# Patient Record
Sex: Male | Born: 1960 | Race: White | Hispanic: No | Marital: Married | Smoking: Current every day smoker
Health system: Southern US, Community
[De-identification: ages and names within clinical notes are randomized; demographics above are authoritative.]

## PROBLEM LIST (undated history)

## (undated) DIAGNOSIS — L97509 Non-pressure chronic ulcer of other part of unspecified foot with unspecified severity: Secondary | ICD-10-CM

## (undated) HISTORY — DX: Non-pressure chronic ulcer of other part of unspecified foot with unspecified severity: L97.509

---

## 1997-09-25 ENCOUNTER — Encounter: Admission: RE | Admit: 1997-09-25 | Discharge: 1997-12-24 | Payer: Self-pay | Admitting: Anesthesiology

## 1998-05-20 ENCOUNTER — Encounter: Admission: RE | Admit: 1998-05-20 | Discharge: 1998-08-18 | Payer: Self-pay | Admitting: Anesthesiology

## 1998-11-16 ENCOUNTER — Encounter: Admission: RE | Admit: 1998-11-16 | Discharge: 1999-02-14 | Payer: Self-pay | Admitting: Anesthesiology

## 1999-05-20 ENCOUNTER — Encounter: Admission: RE | Admit: 1999-05-20 | Discharge: 1999-08-18 | Payer: Self-pay | Admitting: Anesthesiology

## 1999-11-11 ENCOUNTER — Encounter: Admission: RE | Admit: 1999-11-11 | Discharge: 2000-02-09 | Payer: Self-pay | Admitting: Anesthesiology

## 2000-05-29 ENCOUNTER — Encounter: Admission: RE | Admit: 2000-05-29 | Discharge: 2000-08-27 | Payer: Self-pay | Admitting: Anesthesiology

## 2000-09-07 ENCOUNTER — Encounter: Admission: RE | Admit: 2000-09-07 | Discharge: 2000-12-02 | Payer: Self-pay | Admitting: Anesthesiology

## 2001-05-25 ENCOUNTER — Encounter: Payer: Self-pay | Admitting: Anesthesiology

## 2001-05-25 ENCOUNTER — Ambulatory Visit (HOSPITAL_COMMUNITY): Admission: RE | Admit: 2001-05-25 | Discharge: 2001-05-25 | Payer: Self-pay | Admitting: Anesthesiology

## 2001-07-10 ENCOUNTER — Encounter: Payer: Self-pay | Admitting: Neurosurgery

## 2001-07-10 ENCOUNTER — Encounter: Admission: RE | Admit: 2001-07-10 | Discharge: 2001-07-10 | Payer: Self-pay | Admitting: Neurosurgery

## 2001-07-26 ENCOUNTER — Encounter: Payer: Self-pay | Admitting: Neurosurgery

## 2001-07-26 ENCOUNTER — Ambulatory Visit (HOSPITAL_COMMUNITY): Admission: RE | Admit: 2001-07-26 | Discharge: 2001-07-27 | Payer: Self-pay | Admitting: Neurosurgery

## 2001-09-03 ENCOUNTER — Encounter: Admission: RE | Admit: 2001-09-03 | Discharge: 2001-10-03 | Payer: Self-pay | Admitting: Neurosurgery

## 2001-12-28 ENCOUNTER — Encounter: Payer: Self-pay | Admitting: Neurosurgery

## 2001-12-28 ENCOUNTER — Encounter: Admission: RE | Admit: 2001-12-28 | Discharge: 2001-12-28 | Payer: Self-pay | Admitting: Neurosurgery

## 2002-05-17 ENCOUNTER — Encounter: Payer: Self-pay | Admitting: Neurosurgery

## 2002-05-22 ENCOUNTER — Inpatient Hospital Stay (HOSPITAL_COMMUNITY): Admission: RE | Admit: 2002-05-22 | Discharge: 2002-05-27 | Payer: Self-pay | Admitting: Neurosurgery

## 2002-05-22 ENCOUNTER — Encounter: Payer: Self-pay | Admitting: Neurosurgery

## 2002-06-04 ENCOUNTER — Encounter: Admission: RE | Admit: 2002-06-04 | Discharge: 2002-07-25 | Payer: Self-pay | Admitting: Neurosurgery

## 2002-07-08 ENCOUNTER — Encounter: Payer: Self-pay | Admitting: Neurosurgery

## 2002-07-08 ENCOUNTER — Encounter: Admission: RE | Admit: 2002-07-08 | Discharge: 2002-07-08 | Payer: Self-pay | Admitting: Neurosurgery

## 2002-11-15 ENCOUNTER — Encounter: Admission: RE | Admit: 2002-11-15 | Discharge: 2002-11-15 | Payer: Self-pay | Admitting: Neurosurgery

## 2002-11-15 ENCOUNTER — Encounter: Payer: Self-pay | Admitting: Neurosurgery

## 2004-10-29 ENCOUNTER — Ambulatory Visit (HOSPITAL_COMMUNITY): Admission: RE | Admit: 2004-10-29 | Discharge: 2004-10-29 | Payer: Self-pay | Admitting: Anesthesiology

## 2004-12-28 ENCOUNTER — Encounter: Admission: RE | Admit: 2004-12-28 | Discharge: 2005-03-01 | Payer: Self-pay | Admitting: Neurosurgery

## 2009-02-28 ENCOUNTER — Encounter: Admission: RE | Admit: 2009-02-28 | Discharge: 2009-02-28 | Payer: Self-pay | Admitting: Anesthesiology

## 2009-03-31 ENCOUNTER — Encounter: Admission: RE | Admit: 2009-03-31 | Discharge: 2009-04-02 | Payer: Self-pay | Admitting: Neurosurgery

## 2009-04-02 ENCOUNTER — Encounter: Admission: RE | Admit: 2009-04-02 | Discharge: 2009-05-12 | Payer: Self-pay | Admitting: Neurosurgery

## 2010-08-20 NOTE — Op Note (Signed)
NAME:  Scott Flowers, PETTET                          ACCOUNT NO.:  [id number]   MEDICAL RECORD NO.:  [id number]                   PATIENT TYPE:  INP   LOCATION:  3172                                 FACILITY:  [facility]   PHYSICIAN:  Donalee Citrin, M.D.                     DATE OF BIRTH:  04-26-1960   DATE OF PROCEDURE:  05/22/2002  DATE OF DISCHARGE:                                 OPERATIVE REPORT   PREOPERATIVE DIAGNOSIS:  Severe spinal stenosis L4-5 and L5-S1.  Large  ruptured disk, L4-5, L5-S1, right greater than left at L4-5, left greater  than right at L5-S1.  Diskogenic mechanical back pain.   POSTOPERATIVE DIAGNOSIS:  Severe spinal stenosis L4-5 and L5-S1.  Large  ruptured disk, L4-5, L5-S1, right greater than left at L4-5, left greater  than right at L5-S1.  Diskogenic mechanical back pain.   OPERATION PERFORMED:  Redo decompressive laminectomy at L4-5 and redo  decompressive laminectomy at L5-S1.  Posterior lumbar interbody fusion at L4-  5 and L5-S1 using Apex 26 mm tangent allograft wedges, posterolateral  arthrodesis, L4-5, L5-S1.  Pedicle screw fixation, L4-5 and L5-S1.  Placement of a medium Hemovac drain.   SURGEON:  Donalee Citrin, M.D.   ASSISTANT:  Kathaleen Maser. Pool, M.D.   ANESTHESIA:  General endotracheal.   INDICATIONS FOR PROCEDURE:  The patient is a very pleasant 50 year old  gentleman who has had three previous back surgeries.  Ever since his last  back surgery, he has had persistent pain and hypesthesia down the left leg  in what appeared to be an L4 and L5 distribution.  This has been refractory  to conservative treatment with pain management, injections and therapy.  The  patient kept getting progressively worse with worsening intractable back  pain, much greater than leg pain but severe hypesthesias also on the left  leg.  Repeat imaging with MRI and myelography showed severe spinal stenosis  and recurrent ruptured disk partially calcified at L4-5 and L5-S1.  The  patient was extensively counseled about the risks and benefits of redo  surgery on his back and that being a fusion.  He was explained the risks and  benefits of the procedure and he understands and agreed to proceed.   DESCRIPTION OF PROCEDURE:  The patient was brought to the operating room and  was induced under general anesthesia and was placed in the prone position.  The old incision was opened up and then the scar tissue was divided  bilaterally exposing the transverse processes of L4, 5 and S1 and the facet  complex of at each one of these levels with significant amount of scar  tissue appreciated.  Then once the facet complexes were identified and  transverse processes were identified as well as the pedicles of L4-5 and S1,  radical decompressive laminectomy was begun.  Leksell rongeurs were used to  remove the spinous  process and starting at the superior aspect the residual  spinous process of L4 using a 13 mm Kerrison punch, this was removed  centrally exposing the underlying dura.  This was then continued out  laterally, first on the right side and there was noted to be tremendous  amount of scar along the lateral dura on the right.  This was all freed away  with dental dissectors as well as a 4 Penfield exposing the L4 nerve root.  This was adequately decompressed out its foramen.  At the medial aspect of  the facet, this was removed at L4-5 on the right and this was continued down  at L5-S1 on the right.  Then both at the end of the decompression the L4, L5  and S1 neural foramina were widely patent.  There was noted to be a very  large calcified disk at L4-5 compressing both the L4 and L5 nerve root on  the right and L5-S1 was noted to be partially calcified disk there as well.  Attention was taken to the left side.  Starting from the right side and  working over, the scar tissue was freed up.  Using 3 and 4 mm Kerrison punch  the remainder of the laminotomy was completed at L4  decompressing the L4  nerve root radically out its foramen.  This was noted to be completely  decompressed.  Then attention was taken to identify the L5 nerve root.  This  was noted to be very tight in the foramen with a large amount of scar tissue  and facet arthropathy.  This was also removed in piecemeal fashion with  microdissection technique removing scar tissue.  Then there was noted to be  a very large calcified disk and tremendous amount of scar tissue and  stenosis at L5-S1.  This was all removed in piecemeal fashion decompressing  the S1 nerve root.  There was noted to be a very large calcified disk  residual compressing both the L5 and S1 nerve root.  Then at the end of the  decompression with all L4, L5 and S1 nerve roots bilaterally exposed,  attention was taken to diskectomy.  First starting on the right side,  annulotomy was made with pituitary rongeurs were used to clean out the disk  space and an 8 distractor was inserted on the right side at L5-S1 and  fluoroscopy confirmed this to be the proper size.  Then D'Errico retractor  was used to reflect the scar tissue at L5-S1 medially.  This was all  dissected free with dental dissectors and 4 Penfield and a very large  calcified ruptured disk was removed with pituitary rongeurs and downgoing  Epstein curets as well as Kerrison punches decompressing both the S1 and L5  nerve roots on that side.  At the end of this, both a size 8 cutter and  chisel were used to prepare the end plates and an 8 x 26 mm tangent  allograft inserted on the left side.  Fluoroscopy confirmed depth and  trajectory on the right.  This procedure was repeated on the right side with  an 8 x 26 mm tangent allograft after locally harvested autograft was packed  against the allograft on the left.  Fluoroscopy again confirmed depth and  trajectory of all positions along the way.  Then on the left side at L4-5 this disk was cleaned out radically and an 8  distractor was inserted and on  the right side there was noted to be  a very large ruptured disk that was all  partially calcified and stuck at the surface of the L5 nerve root.  This was  teased away with 4 Penfield dental dissectors and downgoing Epstein curets.  A small dural rent was appreciated at the level of the disk space at L4-5.  This was oversewn with a 6-0 Prolene and the remainder of the disk was  removed from underneath this as well as a large piece of calcified fragment  compressing the right L4 nerve root.  This was all removed and both the L4  and L5 nerve were rapidly decompressed at the end of the diskectomy and  using a size 8 cutter and chisel, this end plate was prepared to receive a  bone graft 8 x 26 mm tangent allograft was placed on the  right side at L4-  5.  Then the 8 distractor was removed and the left side at L4-5 was  radically cleaned out and then the 8 x 24 mm tangent allograft inserted  there.  Both allograft and wedges were in good position, confirmed by  fluoroscopy  and all neural foramina were widely patent.  All nerves and  thecal sac were widely decompressed at that point.  Then attention was  turned to pedicle screw placement.  Pedicle screw at L4 on the right was  done first.  Using a high speed drill pilot hole was drilled and cannulated  with the awl, probed, tapped with a 5.5 tap then a 6 and followed by a 6.5  and inserted at L4.  this procedure was repeated at L5 and S1 on the right  with a 6.5 x 45 at L5 and a 6.5 x 40 at S1 and again the procedure was  repeated on the left side.  All pedicles were competent  and free to  orientation upon palpation from within the pedicle and from direct  inspection within the canal.  All pedicles were noted to not violate the  medial aspect of the pedicle.  Then the lateral facet complex and transverse  processes were decorticated __________  packed along the lateral gutters.  The 16 mm rod was sized,  selected and contoured and then tightened down as  S1.  L5 pedicle screws were compressed against S1 and the L4 pedicle screw  compressed against L5.  The crosslink was inserted L4-5 and then Gelfoam was  laid over the left side.  Tisseal was placed on the right side just beside  the dural rent.  Gelfoam was overlaid top of that. He was noted to have no  further signs of leak and due to the patient's size and muscle dissection,  it was felt that it was important to leave the drain for at least 24 hours  to remove the residual oozing from the muscle even though __________  Then  the muscle fascia were reapproximated with 0 interrupted Vicryl,  subcutaneous tissue closed with 2-0 undyed Vicryl, skin closed with nylon.  Bacitracin and wound dressing was applied.  The patient went to recovery in  stable condition.  At the end of the case instrument and sponge counts were  correct.                                                Donalee Citrin, M.D.   GC/MEDQ  D:  05/22/2002  T:  05/22/2002  Job:  [id number]

## 2010-08-20 NOTE — Discharge Summary (Signed)
   NAME:  Scott Flowers, Scott Flowers                          ACCOUNT NO.:  [id number]   MEDICAL RECORD NO.:  [id number]                   PATIENT TYPE:  INP   LOCATION:  3007                                 FACILITY:  [facility]   PHYSICIAN:  Donalee Citrin, M.D.                     DATE OF BIRTH:  21-May-1960   DATE OF ADMISSION:  05/22/2002  DATE OF DISCHARGE:  05/27/2002                                 DISCHARGE SUMMARY   ADMISSION DIAGNOSIS:  Severe degenerative disk disease at L4-5 and L5-S1.   PROCEDURE:  Decompressive laminectomy and fusion, L4-5 and L5-S1.   HOSPITAL COURSE:  The patient is a very pleasant 50 year old gentleman who  has had longstanding back and leg pain and is status post multiple  laminectomies with persistent pain and hyperpathia.  The patient was  admitted and he went to the operating room and underwent the aforementioned  procedure.  Postop the patient did very well in the recovery room and the  floor. The patient had a Hemovac drain placed; however, it likely was  draining some spinal fluid so this was discontinued on the first  postoperative day.  The patient remained afebrile and progressively  mobilized with physical therapy.  He had significant improvement of both his  leg pain and his hyperpathia.  His back pain was well-controlled on pills.  By postop day 5 the patient was ambulating and voiding spontaneously, pain  controlled on pills, discharged home in a brace.                                               Donalee Citrin, M.D.    GC/MEDQ  D:  08/09/2002  T:  08/09/2002  Job:  [id number]

## 2010-08-20 NOTE — H&P (Signed)
[facility]  Patient:    Scott Flowers, Scott Flowers                       MRN: [id number] Adm. Date:  [id number] Attending:  Thyra Breed CC:         Scott Flowers. Scott Flowers, M.D.  Scott Flowers., M.D.   History and Physical  FOLLOWUP EVALUATION:  The patient comes in prematurely because of a problem with back pain which acutely was worsened over the weekend after he sneezed. Initially, on Saturday and Sunday, he felt as though it would bring him to his knees, but through the course of the week with Soma and Percocet up to two tablets per day, it has settled down somewhat.  It is minimal in the mornings but by late in the afternoon, it is up to 5/10.  The pain is localized to the back.  It is a sharp shearing pain which does not radiate out into the extremities.  There is no associated numbness, tingling or bowel or bladder changes.  He feels markedly improved since speaking with [place] on Monday.  PHYSICAL EXAMINATION:  VITAL SIGNS:  Blood pressure 144/80.  Heart rate is 82.  Respiratory rate is 18.  O2 saturation is 98%.  He rates his pain at 2/10.  NEUROMUSCULAR:  Deep tendon reflexes in the lower extremities showed symmetric at the knees, absent right ankle, 1+ left ankle.  Straight leg raise signs are negative.  Gait is intact.  Hyperextension of the back does not increase his pain but forward flexion to about 20 degrees to 30 degrees does increase the pain.  IMPRESSION: 1. Chronic low back pain with underlying degenerative disk disease and facet    joint arthropathy with an acute flare-up which I suspect is probably an    annular rent. 2. Other medical problems per primary care physician.  DISPOSITION: 1. Continue with the Soma and Percocet for the time-being. 2. If he is not better in two weeks, he is to call me and we will consider a    series of lumbar epidural steroid injections versus further imaging of his    back.  He tells me Dr. Loistine Chance C. Flowers  is not interested in any further    surgical interventions for the time-being. DD:  09/08/00 TD:  09/08/00 Job: [id number]

## 2011-07-15 ENCOUNTER — Other Ambulatory Visit: Payer: Self-pay | Admitting: Anesthesiology

## 2011-07-15 DIAGNOSIS — M25551 Pain in right hip: Secondary | ICD-10-CM

## 2011-08-01 ENCOUNTER — Ambulatory Visit
Admission: RE | Admit: 2011-08-01 | Discharge: 2011-08-01 | Disposition: A | Discharge status: Home / Self Care | Payer: BC Managed Care – PPO | Source: Ambulatory Visit | Attending: Anesthesiology | Admitting: Anesthesiology

## 2011-08-01 DIAGNOSIS — M25551 Pain in right hip: Secondary | ICD-10-CM

## 2011-08-01 MED ORDER — GADOBENATE DIMEGLUMINE 529 MG/ML IV SOLN
20.0000 mL | Freq: Once | INTRAVENOUS | Status: AC | PRN
  Administered 2011-08-01: 20 mL via INTRAVENOUS

## 2011-12-08 ENCOUNTER — Ambulatory Visit: Payer: BC Managed Care – PPO

## 2012-07-16 ENCOUNTER — Encounter: Payer: Self-pay | Admitting: Podiatry

## 2012-07-16 ENCOUNTER — Ambulatory Visit (INDEPENDENT_AMBULATORY_CARE_PROVIDER_SITE_OTHER): Payer: BLUE CROSS/BLUE SHIELD | Admitting: Podiatry

## 2012-07-16 VITALS — BP 152/74 | HR 67 | Ht 74.5 in | Wt 295.0 lb

## 2012-07-16 DIAGNOSIS — B351 Tinea unguium: Secondary | ICD-10-CM

## 2012-07-16 DIAGNOSIS — E1140 Type 2 diabetes mellitus with diabetic neuropathy, unspecified: Secondary | ICD-10-CM

## 2012-07-16 DIAGNOSIS — L8990 Pressure ulcer of unspecified site, unspecified stage: Secondary | ICD-10-CM

## 2012-07-16 DIAGNOSIS — L8992 Pressure ulcer of unspecified site, stage 2: Secondary | ICD-10-CM

## 2012-07-16 DIAGNOSIS — M25579 Pain in unspecified ankle and joints of unspecified foot: Secondary | ICD-10-CM

## 2012-07-16 DIAGNOSIS — E1149 Type 2 diabetes mellitus with other diabetic neurological complication: Secondary | ICD-10-CM

## 2012-07-16 NOTE — Patient Instructions (Addendum)
Trimmed fissured heel callus and nails. Return in one months if no problems arise.

## 2012-07-16 NOTE — Progress Notes (Signed)
Subjective: 52 y.o. year old male patient presents complaining of pain on both feet.  Patient is a diabetic with a history of none healing ulcer and frequent fissuring heels.  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 Stated that while he staying at home he was on feet more than usual.   Patient Summary List & History reviewed for allergies, medications, medical problems and surgical history.  Review of Systems - General ROS: negative for - chills, fatigue, fever, malaise, night sweats, weight gain or weight loss  Objective: Dermatologic:  Thin and dry flaky skin on both leg.  Hard excess callus under the first MPJ and great toe right.  Excess callus build up under the 5th MPJ left with pre-ulcerative cracks in callused skin. This is the previous ulcer site.  Both heels have cracking dry callus with fissured heels. No skin is broken at this time.  All nails are thick and hypertrophic.  Vascular: Positive of swollen lower limb bilateral with poor skin texture. DP is palpable bilateral.  Orthopedic:  Rectus foot with excess pressure under the 5th MPJ left foot.  Neurologic:  Loss of normal protective sensory perception bilateral.  Assessment: Dystrophic mycotic nails x 10. Pre-ulcerative skin callus bilateral balls and heels.  Treatment: Both feet were soaked in Aloe solution for 20 minutes. All mycotic nails and pre-ulcerative callused skin lesions debrided.  All excess dry callused skin was also removed.  Vitamin A cream applied to both feet and leg. Advised to try Compression socks to reduce leg edema.  Return in one month unless there is any problems.

## 2012-08-15 ENCOUNTER — Ambulatory Visit: Payer: BLUE CROSS/BLUE SHIELD | Admitting: Podiatry

## 2012-08-21 ENCOUNTER — Ambulatory Visit (INDEPENDENT_AMBULATORY_CARE_PROVIDER_SITE_OTHER): Payer: BC Managed Care – PPO | Admitting: Podiatry

## 2012-08-21 VITALS — BP 156/70 | HR 62

## 2012-08-21 DIAGNOSIS — L84 Corns and callosities: Secondary | ICD-10-CM

## 2012-08-21 DIAGNOSIS — L988 Other specified disorders of the skin and subcutaneous tissue: Secondary | ICD-10-CM

## 2012-08-21 DIAGNOSIS — E1149 Type 2 diabetes mellitus with other diabetic neurological complication: Secondary | ICD-10-CM

## 2012-08-21 DIAGNOSIS — R234 Changes in skin texture: Secondary | ICD-10-CM | POA: Insufficient documentation

## 2012-08-21 NOTE — Patient Instructions (Addendum)
Seen for fissured heel and left foot. All calluses trimmed. Continue to use Moisturizing cream. Return in 1 month.

## 2012-08-21 NOTE — Progress Notes (Signed)
S:  Seen for fissuring heel and pre ulcerative callus left foot under 5th Metatarsal head. He has been to beach and feel his feet are fine. O:  Both feet are in good shape without any acute lesions. Thick broad callus under 5th MPJ left. Fissuring heel right. A:  Pre-ulcerative callus left foot, old ulcer site. Fissuring callus right heel. P:  All debrided and applied moisturizing lotion. Return in one month.

## 2012-09-19 ENCOUNTER — Ambulatory Visit (INDEPENDENT_AMBULATORY_CARE_PROVIDER_SITE_OTHER): Payer: BC Managed Care – PPO | Admitting: Podiatry

## 2012-09-19 VITALS — BP 162/65 | HR 65

## 2012-09-19 DIAGNOSIS — L988 Other specified disorders of the skin and subcutaneous tissue: Secondary | ICD-10-CM

## 2012-09-19 DIAGNOSIS — E1149 Type 2 diabetes mellitus with other diabetic neurological complication: Secondary | ICD-10-CM

## 2012-09-19 DIAGNOSIS — B351 Tinea unguium: Secondary | ICD-10-CM

## 2012-09-19 DIAGNOSIS — M25579 Pain in unspecified ankle and joints of unspecified foot: Secondary | ICD-10-CM

## 2012-09-19 DIAGNOSIS — R234 Changes in skin texture: Secondary | ICD-10-CM

## 2012-09-19 DIAGNOSIS — L84 Corns and callosities: Secondary | ICD-10-CM

## 2012-09-21 NOTE — Progress Notes (Signed)
Subjective:  52 year male presents for follow up on fissuring heel and pre ulcerative callus left foot under 5th Metatarsal head. Also requests for toe nails trimmed. Both feet are numb on bottom and hurts.   Objective:  Thick hypertrophic nails x 10.  Both feet are in good shape without any acute lesions.  Thick broad callus under 5th MPJ left.  Fissuring heel posterior plantar right.  Assessment:  Onychomycosis x 10. Pre-ulcerative callus left foot, old ulcer site.  Fissuring callus right heel.  Peripheral neuropathy. Type II IDDM  Plan:  All debrided pressure callus and fissuring callus,and applied moisturizing lotion.  All nails debrided. Return in one month.

## 2012-10-19 ENCOUNTER — Encounter: Payer: Self-pay | Admitting: Podiatry

## 2012-10-19 ENCOUNTER — Ambulatory Visit (INDEPENDENT_AMBULATORY_CARE_PROVIDER_SITE_OTHER): Payer: BC Managed Care – PPO | Admitting: Podiatry

## 2012-10-19 VITALS — BP 160/68 | HR 68 | Ht 74.5 in | Wt 280.0 lb

## 2012-10-19 DIAGNOSIS — E1149 Type 2 diabetes mellitus with other diabetic neurological complication: Secondary | ICD-10-CM

## 2012-10-19 DIAGNOSIS — R234 Changes in skin texture: Secondary | ICD-10-CM

## 2012-10-19 DIAGNOSIS — L988 Other specified disorders of the skin and subcutaneous tissue: Secondary | ICD-10-CM

## 2012-10-19 NOTE — Progress Notes (Signed)
Subjective:  52 year male presents for follow up on fissuring heel and pre ulcerative callus left foot under 5th Metatarsal head.  Both feet are numb on bottom and hurts.  Objective:  Both feet are in good shape without any acute lesions.  Thick broad callus under 5th MPJ left.  Fissuring heel posterior plantar right.  Assessment:  Pre-ulcerative callus left foot, old ulcer site.  Fissuring callus right heel.  Peripheral neuropathy.  Type II IDDM  Plan:  Soaked both feet for 10 minutes.  Debrided pressure all callus and fissuring callus. Return in 6 weeks.

## 2012-11-30 ENCOUNTER — Ambulatory Visit: Payer: BC Managed Care – PPO | Admitting: Podiatry

## 2012-12-04 ENCOUNTER — Ambulatory Visit (INDEPENDENT_AMBULATORY_CARE_PROVIDER_SITE_OTHER): Payer: BC Managed Care – PPO | Admitting: Podiatry

## 2012-12-04 DIAGNOSIS — B351 Tinea unguium: Secondary | ICD-10-CM

## 2012-12-04 DIAGNOSIS — L988 Other specified disorders of the skin and subcutaneous tissue: Secondary | ICD-10-CM

## 2012-12-04 DIAGNOSIS — M79673 Pain in unspecified foot: Secondary | ICD-10-CM | POA: Insufficient documentation

## 2012-12-04 DIAGNOSIS — R234 Changes in skin texture: Secondary | ICD-10-CM

## 2012-12-04 DIAGNOSIS — L84 Corns and callosities: Secondary | ICD-10-CM

## 2012-12-04 DIAGNOSIS — E1149 Type 2 diabetes mellitus with other diabetic neurological complication: Secondary | ICD-10-CM

## 2012-12-04 DIAGNOSIS — M79609 Pain in unspecified limb: Secondary | ICD-10-CM

## 2012-12-04 NOTE — Progress Notes (Signed)
Subjective:  52 year male presents for follow up on fissuring heel and pre ulcerative callus left foot under 5th Metatarsal head.  Both feet are numb on bottom and hurts.   Objective:  Thick broad calluses under ball, 5th MPJ bilateral. Thick calluses that are cracked with deep fissure on heels posterior plantar bilateral. Thick mycotic nails x 10.  Assessment:  Pre-ulcerative callus left foot, old ulcer site.  Fissuring callus bilateral heel.  Mycotic nails x 10. Peripheral neuropathy.  Type II IDDM   Plan:  Soaked both feet for 10 minutes.  Debrided pressure all callus and fissuring callus and all nails. Return in 4 weeks.

## 2013-01-01 ENCOUNTER — Encounter: Payer: Self-pay | Admitting: Podiatry

## 2013-01-01 ENCOUNTER — Ambulatory Visit (INDEPENDENT_AMBULATORY_CARE_PROVIDER_SITE_OTHER): Payer: BC Managed Care – PPO | Admitting: Podiatry

## 2013-01-01 VITALS — BP 147/66 | HR 66 | Ht 74.0 in | Wt 283.0 lb

## 2013-01-01 DIAGNOSIS — R234 Changes in skin texture: Secondary | ICD-10-CM

## 2013-01-01 DIAGNOSIS — E1149 Type 2 diabetes mellitus with other diabetic neurological complication: Secondary | ICD-10-CM

## 2013-01-01 DIAGNOSIS — M79609 Pain in unspecified limb: Secondary | ICD-10-CM

## 2013-01-01 DIAGNOSIS — L988 Other specified disorders of the skin and subcutaneous tissue: Secondary | ICD-10-CM

## 2013-01-01 NOTE — Patient Instructions (Addendum)
Subjective:  52 year male presents for follow up on fissuring heel and pre ulcerative callus on both feet. Feet are numb on bottom and hurts.  Objective:  Thick broad calluses under ball, 5th MPJ bilateral.  Thick calluses that are cracked with deep fissure on heels posterior plantar bilateral.  Numbness on both feet R>L.  Assessment:  Pre-ulcerative callus left foot, old ulcer site.  Fissuring callus bilateral heel.  Peripheral neuropathy.  Type II IDDM   Plan:  Soaked both feet for 10 minutes.  Debrided pressure all callus and fissuring callus on both feet.  Return after trip.

## 2013-02-19 ENCOUNTER — Encounter: Payer: Self-pay | Admitting: Podiatry

## 2013-02-19 ENCOUNTER — Ambulatory Visit (INDEPENDENT_AMBULATORY_CARE_PROVIDER_SITE_OTHER): Payer: BC Managed Care – PPO | Admitting: Podiatry

## 2013-02-19 VITALS — BP 134/71 | HR 69 | Ht 74.0 in | Wt 287.0 lb

## 2013-02-19 DIAGNOSIS — B351 Tinea unguium: Secondary | ICD-10-CM

## 2013-02-19 DIAGNOSIS — L84 Corns and callosities: Secondary | ICD-10-CM

## 2013-02-19 DIAGNOSIS — L97509 Non-pressure chronic ulcer of other part of unspecified foot with unspecified severity: Secondary | ICD-10-CM

## 2013-02-19 DIAGNOSIS — R234 Changes in skin texture: Secondary | ICD-10-CM

## 2013-02-19 DIAGNOSIS — E1149 Type 2 diabetes mellitus with other diabetic neurological complication: Secondary | ICD-10-CM

## 2013-02-19 DIAGNOSIS — M79609 Pain in unspecified limb: Secondary | ICD-10-CM

## 2013-02-19 DIAGNOSIS — L988 Other specified disorders of the skin and subcutaneous tissue: Secondary | ICD-10-CM

## 2013-02-19 HISTORY — DX: Non-pressure chronic ulcer of other part of unspecified foot with unspecified severity: L97.509

## 2013-02-19 NOTE — Patient Instructions (Signed)
Seen for right great toe wound. Seen another deep crack on right heel with thick callus. All calluses debrided. Right great toe wound is clean and superficial without signs of infection. Return in 2 weeks. Do daily wound care with antibiotic cream.  If the toe gets more red, will put on antibiotics.

## 2013-02-19 NOTE — Progress Notes (Signed)
Seen for right great toe wound. He was on a cruise trip and been on feet a lot. He had skin blister on plantar with redness on toe right hallux.  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 He sent to [place] cell phone picture of the toe yesterday that revealed great toe with loss of skin layer.  Objective: Pink raw skin without sign of infection or necrosis. Dorsal aspect of the great toe has been irritated with mild red purple discoloration in skin and nail. Nail is firm without sign of subungual fluid.  Has noted another deep crack on right heel about 4cm long and 0.4cm deep with thick surrounding callus. All nails are hypertrophic.  Assessment: Friction and pressure induced trauma right hallux with loss of dermal layer. No compound infection noted. Hypertrophic nails x 10. Deep fissure right heel with thick callus. Thick callus left heel.  Treatment: Both feet were soaked for 10 minutes. All calluses debrided. Right great toe wound redressed with Silver cream.  Return in 2 weeks.  Do daily wound care with antibiotic cream.  If the toe gets more red, will put on antibiotics.

## 2013-03-05 ENCOUNTER — Encounter: Payer: Self-pay | Admitting: Podiatry

## 2013-03-05 ENCOUNTER — Ambulatory Visit (INDEPENDENT_AMBULATORY_CARE_PROVIDER_SITE_OTHER): Payer: BC Managed Care – PPO | Admitting: Podiatry

## 2013-03-05 VITALS — BP 159/70 | HR 65 | Ht 74.0 in | Wt 287.0 lb

## 2013-03-05 DIAGNOSIS — L988 Other specified disorders of the skin and subcutaneous tissue: Secondary | ICD-10-CM

## 2013-03-05 DIAGNOSIS — R234 Changes in skin texture: Secondary | ICD-10-CM

## 2013-03-05 DIAGNOSIS — L84 Corns and callosities: Secondary | ICD-10-CM

## 2013-03-05 DIAGNOSIS — E1149 Type 2 diabetes mellitus with other diabetic neurological complication: Secondary | ICD-10-CM

## 2013-03-05 DIAGNOSIS — L97509 Non-pressure chronic ulcer of other part of unspecified foot with unspecified severity: Secondary | ICD-10-CM

## 2013-03-05 NOTE — Progress Notes (Signed)
Subjective: Follow up on right great toe wound. Stated that he was using antibiotic creams on the right big toe wound.   Objective: Wound size decreased to 0.5cm openning center limited to dermal layer. Pink raw skin without sign of infection or necrosis.  Dorsal aspect of the great toe has been irritated with mild red purple discoloration in skin and nail.  Nail is firm without sign of subungual fluid.  Has noted another deep crack on right heel about 4cm long and 0.4cm deep with thick surrounding callus.  All nails are hypertrophic.   Assessment: Friction and pressure induced right hallucal skin lesion improved.  No compound infection noted.  Deep fissure on both heels with thick callused edges and borders. Thick and hard calluses under the first and 5th MPJ area bilateral.    Treatment:  Both feet hard callused lesions debrided.  Right great toe wound redressed with Silver cream.  Return in 3 weeks.  Do daily wound care with antibiotic cream.  Continue with minimum weight bearing bilateral.

## 2013-03-05 NOTE — Patient Instructions (Signed)
Seen for ulcer right great toe. Doing better. Having other area cracked. Need more moisturizing cream. Return in 3 weeks.

## 2013-03-26 ENCOUNTER — Encounter: Payer: Self-pay | Admitting: Podiatry

## 2013-03-26 ENCOUNTER — Ambulatory Visit (INDEPENDENT_AMBULATORY_CARE_PROVIDER_SITE_OTHER): Payer: BC Managed Care – PPO | Admitting: Podiatry

## 2013-03-26 VITALS — BP 142/71 | HR 70 | Ht 74.0 in | Wt 287.0 lb

## 2013-03-26 DIAGNOSIS — M79609 Pain in unspecified limb: Secondary | ICD-10-CM

## 2013-03-26 DIAGNOSIS — E1149 Type 2 diabetes mellitus with other diabetic neurological complication: Secondary | ICD-10-CM

## 2013-03-26 DIAGNOSIS — L84 Corns and callosities: Secondary | ICD-10-CM

## 2013-03-26 DIAGNOSIS — R234 Changes in skin texture: Secondary | ICD-10-CM

## 2013-03-26 DIAGNOSIS — L97509 Non-pressure chronic ulcer of other part of unspecified foot with unspecified severity: Secondary | ICD-10-CM

## 2013-03-26 NOTE — Progress Notes (Signed)
Subjective: Follow up on right great toe wound. Stated that he was using antibiotic creams on the right big toe wound.  Objective:  No open wounds.  Multiple fissured heel and cracks on bottom of both feet.  Thick calluses on both heels and balls.  Assessment: Deep fissure on both heels with thick callused edges and borders.  Thick and hard calluses under the first and 5th MPJ area bilateral.   Treatment:  Both feet soaked and hard callused fissured lesions debrided.  Return in 4 weeks.

## 2013-03-26 NOTE — Patient Instructions (Signed)
Debrided all fissured calluses. Return in one month.

## 2013-04-23 ENCOUNTER — Ambulatory Visit (INDEPENDENT_AMBULATORY_CARE_PROVIDER_SITE_OTHER): Payer: BLUE CROSS/BLUE SHIELD | Admitting: Podiatry

## 2013-04-23 ENCOUNTER — Encounter: Payer: Self-pay | Admitting: Podiatry

## 2013-04-23 VITALS — BP 144/63 | HR 70

## 2013-04-23 DIAGNOSIS — L97509 Non-pressure chronic ulcer of other part of unspecified foot with unspecified severity: Secondary | ICD-10-CM

## 2013-04-23 DIAGNOSIS — R234 Changes in skin texture: Secondary | ICD-10-CM

## 2013-04-23 DIAGNOSIS — E1149 Type 2 diabetes mellitus with other diabetic neurological complication: Secondary | ICD-10-CM

## 2013-04-23 DIAGNOSIS — M79609 Pain in unspecified limb: Secondary | ICD-10-CM

## 2013-04-23 DIAGNOSIS — L988 Other specified disorders of the skin and subcutaneous tissue: Secondary | ICD-10-CM

## 2013-04-23 DIAGNOSIS — M79673 Pain in unspecified foot: Secondary | ICD-10-CM

## 2013-04-23 NOTE — Progress Notes (Signed)
Subjective:  53 year old male presents with cracked and callused lesions on both heels.  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  His blood sugar was 170 before breakfast today.   Objective: 3 Deep fissure on both heels 2 deep cracks 3 cm long on left heel and one deep crack  3 cm on right heel. Thick broad callus under 5th MPJ left healed from ulcer.  Dorsal aspect of the great toe has been irritated with mild red purple discoloration in skin and nail.  Dry scaly skin on both lower limbs and feet dorsum and plantar.   Assessment: Deep fissures with broken skin on both heels with thick callused edges and borders.  Thick and hard calluses under the first and 5th MPJ area bilateral.   Treatment:  Both feet hard callused lesions and under the 5 th MPJ left debrided.  Amerigel ointment applied to cracked skin. Vitamin A cream applied to all dry skin.   Return in 3 weeks.

## 2013-04-23 NOTE — Patient Instructions (Signed)
Debrided callused cracked lesions on both heels.  Return in 3 weeks.

## 2013-05-14 ENCOUNTER — Encounter: Payer: Self-pay | Admitting: Podiatry

## 2013-05-14 ENCOUNTER — Ambulatory Visit: Payer: BC Managed Care – PPO | Admitting: Podiatry

## 2013-05-14 ENCOUNTER — Ambulatory Visit (INDEPENDENT_AMBULATORY_CARE_PROVIDER_SITE_OTHER): Payer: BLUE CROSS/BLUE SHIELD | Admitting: Podiatry

## 2013-05-14 ENCOUNTER — Ambulatory Visit: Payer: BLUE CROSS/BLUE SHIELD | Admitting: Podiatry

## 2013-05-14 VITALS — BP 148/69 | HR 65

## 2013-05-14 DIAGNOSIS — L988 Other specified disorders of the skin and subcutaneous tissue: Secondary | ICD-10-CM

## 2013-05-14 DIAGNOSIS — M79609 Pain in unspecified limb: Secondary | ICD-10-CM

## 2013-05-14 DIAGNOSIS — E1140 Type 2 diabetes mellitus with diabetic neuropathy, unspecified: Secondary | ICD-10-CM | POA: Insufficient documentation

## 2013-05-14 DIAGNOSIS — E1142 Type 2 diabetes mellitus with diabetic polyneuropathy: Secondary | ICD-10-CM

## 2013-05-14 DIAGNOSIS — R234 Changes in skin texture: Secondary | ICD-10-CM

## 2013-05-14 DIAGNOSIS — E1149 Type 2 diabetes mellitus with other diabetic neurological complication: Secondary | ICD-10-CM

## 2013-05-14 DIAGNOSIS — L97509 Non-pressure chronic ulcer of other part of unspecified foot with unspecified severity: Secondary | ICD-10-CM

## 2013-05-14 DIAGNOSIS — M79673 Pain in unspecified foot: Secondary | ICD-10-CM

## 2013-05-14 NOTE — Patient Instructions (Signed)
Right first and 2nd digit is red and inflamed following staple incident.  Continue with antibiotics oral and topical. Return in 3 weeks for fissured heel.

## 2013-05-14 NOTE — Progress Notes (Signed)
Stated that he was in ER after he stepped on staples with first and 2nd digits right. He did not feel any pain. He was treated at ER and put on antibiotics. He is taking Augmentin. He still uses moisturizing cream and antibiotic cream for dry cracking skin. Objective: Right first and 2nd digit show localized erythematous area on surface without deep opening. No drainage noted. Both heels have deep fissures with hard keratotic tissue on holding gaps on either side.  Left heel fissures range from 3cm to 1.5cm long. No skin opening or deep penetration noted.  Thick broad plantar callus under 5th MPJ left. Right heel fissures range from 2 cm-1 cm without deep penetration.  Both feet are extremely dry and scaly top and bottom.  Assessment: Fresh wound right 1st and 2nd digit following incident, healing in progress. Fissured heels bilateral multiple without deep opening.  Plantar callus under 5th MPj left with history of slow healing ulcer. Diabetic neuropathy lower limbs.   Plan: All lesions debrided both feet. Right first and 2nd digit redressed after cleansing H2O2, applied Amerigel ointment.

## 2013-06-04 ENCOUNTER — Encounter: Payer: Self-pay | Admitting: Podiatry

## 2013-06-04 ENCOUNTER — Ambulatory Visit (INDEPENDENT_AMBULATORY_CARE_PROVIDER_SITE_OTHER): Payer: BLUE CROSS/BLUE SHIELD | Admitting: Podiatry

## 2013-06-04 VITALS — BP 142/65 | HR 63

## 2013-06-04 DIAGNOSIS — E1149 Type 2 diabetes mellitus with other diabetic neurological complication: Secondary | ICD-10-CM

## 2013-06-04 DIAGNOSIS — M79609 Pain in unspecified limb: Secondary | ICD-10-CM

## 2013-06-04 DIAGNOSIS — L988 Other specified disorders of the skin and subcutaneous tissue: Secondary | ICD-10-CM

## 2013-06-04 DIAGNOSIS — R234 Changes in skin texture: Secondary | ICD-10-CM

## 2013-06-04 DIAGNOSIS — E1140 Type 2 diabetes mellitus with diabetic neuropathy, unspecified: Secondary | ICD-10-CM

## 2013-06-04 DIAGNOSIS — M79606 Pain in leg, unspecified: Secondary | ICD-10-CM

## 2013-06-04 DIAGNOSIS — L97509 Non-pressure chronic ulcer of other part of unspecified foot with unspecified severity: Secondary | ICD-10-CM

## 2013-06-04 DIAGNOSIS — E1142 Type 2 diabetes mellitus with diabetic polyneuropathy: Secondary | ICD-10-CM

## 2013-06-04 DIAGNOSIS — B351 Tinea unguium: Secondary | ICD-10-CM

## 2013-06-04 NOTE — Progress Notes (Signed)
Follow up on right foot lesion and ongoing fissured heels.   Objective: Previous lesions dry and scabbed on the 1st and 2nd digit.  No change in heel lesions. Both heels have deep fissures with hard keratotic tissue on holding gaps on either side.  Left heel fissures range from 3cm to 1.5cm long. No skin opening or deep penetration noted.  Thick broad plantar callus under 5th MPJ left.  Right heel fissures range from 2 cm-1 cm without deep penetration.  Both feet are extremely dry and scaly top and bottom.  All nails are hypertrophic.   Assessment: Healed wound on right 1st and 2nd digit following staple incident. Fissured heels bilateral with thick bordered callus.   Plantar callus under 5th MPj left with history of slow healing ulcer.  Diabetic neuropathy lower limbs.  Onychomycosis. Pain in lower limb.  Plan: Both feet soaked for 20 minutes.  All nails and lesions debrided both feet.  Return in 3 weeks to repeat.

## 2013-06-04 NOTE — Patient Instructions (Signed)
Seen for lesion on right foot and cracked heel. All nails and lesions debrided. Return in 3 weeks.

## 2013-06-25 ENCOUNTER — Ambulatory Visit (INDEPENDENT_AMBULATORY_CARE_PROVIDER_SITE_OTHER): Payer: BLUE CROSS/BLUE SHIELD | Admitting: Podiatry

## 2013-06-25 ENCOUNTER — Encounter: Payer: Self-pay | Admitting: Podiatry

## 2013-06-25 VITALS — BP 144/66 | HR 56 | Ht 74.0 in | Wt 285.0 lb

## 2013-06-25 DIAGNOSIS — L97509 Non-pressure chronic ulcer of other part of unspecified foot with unspecified severity: Secondary | ICD-10-CM

## 2013-06-25 DIAGNOSIS — E1149 Type 2 diabetes mellitus with other diabetic neurological complication: Secondary | ICD-10-CM

## 2013-06-25 DIAGNOSIS — L988 Other specified disorders of the skin and subcutaneous tissue: Secondary | ICD-10-CM

## 2013-06-25 DIAGNOSIS — E1140 Type 2 diabetes mellitus with diabetic neuropathy, unspecified: Secondary | ICD-10-CM

## 2013-06-25 DIAGNOSIS — M79609 Pain in unspecified limb: Secondary | ICD-10-CM

## 2013-06-25 DIAGNOSIS — M79606 Pain in leg, unspecified: Secondary | ICD-10-CM

## 2013-06-25 DIAGNOSIS — R234 Changes in skin texture: Secondary | ICD-10-CM

## 2013-06-25 DIAGNOSIS — E1142 Type 2 diabetes mellitus with diabetic polyneuropathy: Secondary | ICD-10-CM

## 2013-06-25 NOTE — Patient Instructions (Signed)
Seen for painful and cracking calluses. All calluses debrided. Return in 3 weeks or as needed.

## 2013-06-25 NOTE — Progress Notes (Signed)
Subjective: Follow up for on going ulceration and fissure both feet.  Objective: Old ulcerated lesion is covered with thick callus on the 1st and 2nd digit right. Both heels have deep fissures with hard keratotic tissue on either side of gaps.  Left heel fissures range from 3cm to 1.5cm long. No skin opening or deep penetration noted.  Thick broad plantar callus under 5th MPJ left.  Right heel fissures range from 2 cm-1 cm without deep penetration.  Both feet are extremely dry and scaly top and bottom.  No active drainage noted.   Assessment: Healed wound on right 1st and 2nd digit following staple incident.  Fissured heels bilateral with thick bordered callus.  Plantar callus under 5th MPj left with history of slow healing ulcer.  Diabetic neuropathy lower limbs.  Pain in lower limb.   Plan: Both feet soaked for 20 minutes.  All lesions debrided both feet.  Return in 3 weeks to repeat

## 2013-07-16 ENCOUNTER — Encounter: Payer: Self-pay | Admitting: Podiatry

## 2013-07-16 ENCOUNTER — Ambulatory Visit (INDEPENDENT_AMBULATORY_CARE_PROVIDER_SITE_OTHER): Payer: BLUE CROSS/BLUE SHIELD | Admitting: Podiatry

## 2013-07-16 VITALS — BP 147/74 | HR 61

## 2013-07-16 DIAGNOSIS — E1142 Type 2 diabetes mellitus with diabetic polyneuropathy: Secondary | ICD-10-CM

## 2013-07-16 DIAGNOSIS — L988 Other specified disorders of the skin and subcutaneous tissue: Secondary | ICD-10-CM

## 2013-07-16 DIAGNOSIS — M79606 Pain in leg, unspecified: Secondary | ICD-10-CM

## 2013-07-16 DIAGNOSIS — M79609 Pain in unspecified limb: Secondary | ICD-10-CM

## 2013-07-16 DIAGNOSIS — E1149 Type 2 diabetes mellitus with other diabetic neurological complication: Secondary | ICD-10-CM

## 2013-07-16 DIAGNOSIS — L97509 Non-pressure chronic ulcer of other part of unspecified foot with unspecified severity: Secondary | ICD-10-CM

## 2013-07-16 DIAGNOSIS — E1140 Type 2 diabetes mellitus with diabetic neuropathy, unspecified: Secondary | ICD-10-CM

## 2013-07-16 DIAGNOSIS — R234 Changes in skin texture: Secondary | ICD-10-CM

## 2013-07-16 NOTE — Progress Notes (Signed)
Subjective: Follow up for on going ulceration and fissure both feet.   Objective: Old ulcerated lesion has healed and left with thick calluses on both feet.  Both heels have deep fissures with hard keratotic tissue on either side of gaps.  Both heel fissures are 3cm long with deep dermal penetration. Thick broad plantar callus under 5th MPJ left.  Both feet are extremely dry and scaly top and bottom.  No active drainage or erythema noted.   Assessment: Ulcers on right 1st and 2nd digit has healed.  Fissured heels bilateral with thick bordered calluses enlarged in length and depth.  Diabetic neuropathy lower limbs.  Pain in lower limb.   Plan: Both feet soaked for 20 minutes.  All lesions debrided both feet.  Both heels dressed with Amerigel ointment and occlusive dressing. Patient is to keep the area covered with new dressings.  Return in 3 weeks to repeat

## 2013-07-16 NOTE — Patient Instructions (Signed)
Seen for heel lesions. Both heels are callused with cracked deep and long. Debrided and dressed. Return in 2 weeks.

## 2013-07-30 ENCOUNTER — Encounter: Payer: Self-pay | Admitting: Podiatry

## 2013-07-30 ENCOUNTER — Ambulatory Visit (INDEPENDENT_AMBULATORY_CARE_PROVIDER_SITE_OTHER): Payer: BLUE CROSS/BLUE SHIELD | Admitting: Podiatry

## 2013-07-30 VITALS — BP 160/69 | HR 67

## 2013-07-30 DIAGNOSIS — E1142 Type 2 diabetes mellitus with diabetic polyneuropathy: Secondary | ICD-10-CM

## 2013-07-30 DIAGNOSIS — B351 Tinea unguium: Secondary | ICD-10-CM

## 2013-07-30 DIAGNOSIS — M79609 Pain in unspecified limb: Secondary | ICD-10-CM

## 2013-07-30 DIAGNOSIS — R234 Changes in skin texture: Secondary | ICD-10-CM

## 2013-07-30 DIAGNOSIS — M79606 Pain in leg, unspecified: Secondary | ICD-10-CM

## 2013-07-30 DIAGNOSIS — E1140 Type 2 diabetes mellitus with diabetic neuropathy, unspecified: Secondary | ICD-10-CM

## 2013-07-30 DIAGNOSIS — E1149 Type 2 diabetes mellitus with other diabetic neurological complication: Secondary | ICD-10-CM

## 2013-07-30 DIAGNOSIS — L988 Other specified disorders of the skin and subcutaneous tissue: Secondary | ICD-10-CM

## 2013-07-30 NOTE — Patient Instructions (Signed)
Debrided all nails and calluses. Return in 3 weeks.

## 2013-07-30 NOTE — Progress Notes (Signed)
Subjective: 53 year old diabetic patient presents for diabetic foot care. He has frequent ulceration, fissured skin on both heels with fungal nails.  Stated that his HgA1c is 9.   Objective: Fissurating thick calluses on both feet.  Both heels have deep fissures with hard keratotic tissue on either side of gaps.  The base of the fissures has healed with healthy dermal layer.  Positive of thick broad plantar callus under 5th MPJ left.  Both feet are extremely dry and scaly top and bottom.  No active drainage or erythema noted.  Positive of thick dystrophic nails x 10.  Assessment: Resolved ulcers.  Thick dystrophic nails x 10. Fissured heels bilateral with thick bordered calluses enlarged in length and depth without opening this time. Diabetic neuropathy lower limbs.  Pain in lower limb.   Plan: All keratotic lesions debrided both feet.  All nails debrided.  Vitamin A cream applied to both feet.  Return in 3 weeks to repeat

## 2013-08-20 ENCOUNTER — Ambulatory Visit (INDEPENDENT_AMBULATORY_CARE_PROVIDER_SITE_OTHER): Payer: BLUE CROSS/BLUE SHIELD | Admitting: Podiatry

## 2013-08-20 ENCOUNTER — Encounter: Payer: Self-pay | Admitting: Podiatry

## 2013-08-20 VITALS — BP 169/74 | HR 62

## 2013-08-20 DIAGNOSIS — R234 Changes in skin texture: Secondary | ICD-10-CM

## 2013-08-20 DIAGNOSIS — E1149 Type 2 diabetes mellitus with other diabetic neurological complication: Secondary | ICD-10-CM

## 2013-08-20 DIAGNOSIS — L84 Corns and callosities: Secondary | ICD-10-CM

## 2013-08-20 DIAGNOSIS — E1142 Type 2 diabetes mellitus with diabetic polyneuropathy: Secondary | ICD-10-CM

## 2013-08-20 DIAGNOSIS — E1140 Type 2 diabetes mellitus with diabetic neuropathy, unspecified: Secondary | ICD-10-CM

## 2013-08-20 DIAGNOSIS — M79609 Pain in unspecified limb: Secondary | ICD-10-CM

## 2013-08-20 DIAGNOSIS — L988 Other specified disorders of the skin and subcutaneous tissue: Secondary | ICD-10-CM

## 2013-08-20 DIAGNOSIS — M79606 Pain in leg, unspecified: Secondary | ICD-10-CM

## 2013-08-20 NOTE — Patient Instructions (Signed)
Seen for painful feet. All calluses debrided. Return in 3 weeks.

## 2013-08-20 NOTE — Progress Notes (Signed)
Subjective: 53 year old diabetic patient presents for diabetic foot care. He has frequent ulceration, fissured skin on both heels with fungal nails.   Objective: Fissurating thick calluses on both feet.  Positive of thick broad plantar callus under 5th MPJ left and both heel.  Both feet are extremely dry and scaly top and bottom.  No active drainage or erythema noted.  No deep fissure noted during this visit.   Assessment: Improved heels bilateral with thick calluses. Diabetic neuropathy lower limbs.  Pain in lower limb.   Plan: Soaked for 10 minutes.  All keratotic lesions debrided both feet.  Vitamin A cream applied to both feet.  Return in 3 weeks to repeat

## 2013-09-10 ENCOUNTER — Ambulatory Visit (INDEPENDENT_AMBULATORY_CARE_PROVIDER_SITE_OTHER): Payer: BLUE CROSS/BLUE SHIELD | Admitting: Podiatry

## 2013-09-10 ENCOUNTER — Encounter: Payer: Self-pay | Admitting: Podiatry

## 2013-09-10 VITALS — BP 130/68 | HR 62

## 2013-09-10 DIAGNOSIS — E1149 Type 2 diabetes mellitus with other diabetic neurological complication: Secondary | ICD-10-CM

## 2013-09-10 DIAGNOSIS — L988 Other specified disorders of the skin and subcutaneous tissue: Secondary | ICD-10-CM

## 2013-09-10 DIAGNOSIS — R234 Changes in skin texture: Secondary | ICD-10-CM

## 2013-09-10 DIAGNOSIS — M79609 Pain in unspecified limb: Secondary | ICD-10-CM

## 2013-09-10 DIAGNOSIS — E1142 Type 2 diabetes mellitus with diabetic polyneuropathy: Secondary | ICD-10-CM

## 2013-09-10 DIAGNOSIS — E1140 Type 2 diabetes mellitus with diabetic neuropathy, unspecified: Secondary | ICD-10-CM

## 2013-09-10 DIAGNOSIS — M79606 Pain in leg, unspecified: Secondary | ICD-10-CM

## 2013-09-10 NOTE — Patient Instructions (Signed)
Seen for cracked heel. All debrided. Return in 3 weeks.

## 2013-09-10 NOTE — Progress Notes (Signed)
Subjective: 53 year old diabetic patient presents for fissured skin on both heels.   Objective: Cracking and fissurating thick calluses on both feet.  Positive of thick broad plantar callus under 5th MPJ left and both heel.  Both feet are extremely dry and scaly top and bottom.   Assessment: Fissured callus both heels. Diabetic neuropathy lower limbs.  Pain in lower limb.   Plan: Soaked for 10 minutes.  All keratotic lesions debrided both feet.  Vitamin A cream applied to both feet.  Return in 3 weeks to repeat

## 2013-10-01 ENCOUNTER — Ambulatory Visit (INDEPENDENT_AMBULATORY_CARE_PROVIDER_SITE_OTHER): Payer: BLUE CROSS/BLUE SHIELD | Admitting: Podiatry

## 2013-10-01 ENCOUNTER — Encounter: Payer: Self-pay | Admitting: Podiatry

## 2013-10-01 DIAGNOSIS — R234 Changes in skin texture: Secondary | ICD-10-CM

## 2013-10-01 DIAGNOSIS — E1149 Type 2 diabetes mellitus with other diabetic neurological complication: Secondary | ICD-10-CM

## 2013-10-01 DIAGNOSIS — L988 Other specified disorders of the skin and subcutaneous tissue: Secondary | ICD-10-CM

## 2013-10-01 DIAGNOSIS — M79606 Pain in leg, unspecified: Secondary | ICD-10-CM

## 2013-10-01 DIAGNOSIS — B351 Tinea unguium: Secondary | ICD-10-CM

## 2013-10-01 DIAGNOSIS — M79609 Pain in unspecified limb: Secondary | ICD-10-CM

## 2013-10-01 NOTE — Patient Instructions (Signed)
Seen for hypertrophic nails and fissured calluses. All nails and calluses debrided. Return in 3 months or as needed.

## 2013-10-01 NOTE — Progress Notes (Signed)
Subjective: 53 year old diabetic patient presents for fissured skin on both heels.   Objective: Thick dystrophic nails x 10. Cracking and fissurating thick calluses on both feet.  Positive of thick broad plantar callus under 5th MPJ left and both heel.  Both feet are extremely dry and scaly top and bottom.   Assessment: Onychomycosis x 10.  Fissured callus both heels.  Diabetic neuropathy lower limbs.  Pain in lower limb.   Plan: Soaked for 10 minutes.  All keratotic lesions and toe nails debrided both feet.  Vitamin A cream applied to both feet.  Return in 3 weeks to repeat

## 2013-10-29 ENCOUNTER — Ambulatory Visit (INDEPENDENT_AMBULATORY_CARE_PROVIDER_SITE_OTHER): Payer: BLUE CROSS/BLUE SHIELD | Admitting: Podiatry

## 2013-10-29 ENCOUNTER — Encounter: Payer: Self-pay | Admitting: Podiatry

## 2013-10-29 VITALS — BP 124/58 | HR 58

## 2013-10-29 DIAGNOSIS — G909 Disorder of the autonomic nervous system, unspecified: Secondary | ICD-10-CM

## 2013-10-29 DIAGNOSIS — B351 Tinea unguium: Secondary | ICD-10-CM

## 2013-10-29 DIAGNOSIS — L988 Other specified disorders of the skin and subcutaneous tissue: Secondary | ICD-10-CM

## 2013-10-29 DIAGNOSIS — M79609 Pain in unspecified limb: Secondary | ICD-10-CM

## 2013-10-29 DIAGNOSIS — R234 Changes in skin texture: Secondary | ICD-10-CM

## 2013-10-29 DIAGNOSIS — E1149 Type 2 diabetes mellitus with other diabetic neurological complication: Secondary | ICD-10-CM

## 2013-10-29 DIAGNOSIS — E0843 Diabetes mellitus due to underlying condition with diabetic autonomic (poly)neuropathy: Secondary | ICD-10-CM

## 2013-10-29 DIAGNOSIS — M79606 Pain in leg, unspecified: Secondary | ICD-10-CM

## 2013-10-29 NOTE — Progress Notes (Signed)
Subjective: 53 year old diabetic patient presents for follow up on lesions on both heels.  He was on feet more than usual. Feet are hurting at this time.   Objective: Noted of no new problems.  Thick dystrophic nails x 10.  Cracking and fissurating thick calluses on both feet.  Positive of thick broad plantar callus under 5th MPJ left and both heel.  Both feet are extremely dry and scaly top and bottom.   Assessment: Onychomycosis x 10.  Fissured callus both heels.  Diabetic neuropathy lower limbs.  Pain in lower limb.   Plan: Soaked for 10 minutes.  All keratotic lesions and toe nails debrided both feet.  Vitamin A cream applied to both feet.  Return in 3 weeks to repeat

## 2013-10-29 NOTE — Patient Instructions (Signed)
Seen for hypertrophic nails and calluses. All nails and calluses debrided. Return in 3 weeks or as needed.

## 2013-11-19 ENCOUNTER — Encounter: Payer: Self-pay | Admitting: Podiatry

## 2013-11-19 ENCOUNTER — Ambulatory Visit (INDEPENDENT_AMBULATORY_CARE_PROVIDER_SITE_OTHER): Payer: BLUE CROSS/BLUE SHIELD | Admitting: Podiatry

## 2013-11-19 VITALS — BP 150/67 | HR 67

## 2013-11-19 DIAGNOSIS — M79609 Pain in unspecified limb: Secondary | ICD-10-CM

## 2013-11-19 DIAGNOSIS — L97509 Non-pressure chronic ulcer of other part of unspecified foot with unspecified severity: Secondary | ICD-10-CM

## 2013-11-19 DIAGNOSIS — M79606 Pain in leg, unspecified: Secondary | ICD-10-CM

## 2013-11-19 DIAGNOSIS — R234 Changes in skin texture: Secondary | ICD-10-CM

## 2013-11-19 DIAGNOSIS — E1149 Type 2 diabetes mellitus with other diabetic neurological complication: Secondary | ICD-10-CM

## 2013-11-19 DIAGNOSIS — L988 Other specified disorders of the skin and subcutaneous tissue: Secondary | ICD-10-CM

## 2013-11-19 NOTE — Progress Notes (Signed)
Subjective: 53 year old diabetic patient presents for follow up on cracked lesions on both heels.   Objective:   Cracking and fissurating thick calluses on both feet.  Positive of thick broad plantar callus under 5th MPJ left and both heel.  Both feet are extremely dry and scaly top and bottom.   Assessment: Fissured callus both heels.  Diabetic neuropathy lower limbs.  Pain in lower limb.   Plan: Soaked for 10 minutes.  All keratotic lesions debrided both feet.  Vitamin A cream applied to both feet.  Return in 3 weeks to repeat

## 2013-11-19 NOTE — Patient Instructions (Signed)
Seen for hypertrophic calluses. All calluses  debrided. Return in 3 weeks.

## 2013-12-11 ENCOUNTER — Encounter: Payer: Self-pay | Admitting: Podiatry

## 2013-12-11 ENCOUNTER — Ambulatory Visit (INDEPENDENT_AMBULATORY_CARE_PROVIDER_SITE_OTHER): Payer: BLUE CROSS/BLUE SHIELD | Admitting: Podiatry

## 2013-12-11 VITALS — BP 149/68 | HR 67

## 2013-12-11 DIAGNOSIS — M79606 Pain in leg, unspecified: Secondary | ICD-10-CM

## 2013-12-11 DIAGNOSIS — M79609 Pain in unspecified limb: Secondary | ICD-10-CM

## 2013-12-11 DIAGNOSIS — B351 Tinea unguium: Secondary | ICD-10-CM

## 2013-12-11 NOTE — Progress Notes (Signed)
Subjective: 53 year old diabetic patient presents for follow up on cracked lesions on both heels.   Objective:  Resolved cracks and fissures this time.  Thick dystrophic nails x 10. Both feet are extremely dry and scaly top and bottom.   Assessment: Onychomycosis x 10.  Diabetic neuropathy lower limbs.  Pain in lower limb.   Plan: All nails debrided.  Return in 3 weeks to check on heels.

## 2013-12-11 NOTE — Patient Instructions (Signed)
Seen for hypertrophic nails and calluses. All nails and calluses debrided. Return in 3 week or  Sooner as needed.

## 2014-01-01 ENCOUNTER — Ambulatory Visit (INDEPENDENT_AMBULATORY_CARE_PROVIDER_SITE_OTHER): Payer: BLUE CROSS/BLUE SHIELD | Admitting: Podiatry

## 2014-01-01 ENCOUNTER — Encounter: Payer: Self-pay | Admitting: Podiatry

## 2014-01-01 VITALS — BP 152/67 | HR 66

## 2014-01-01 DIAGNOSIS — M79609 Pain in unspecified limb: Secondary | ICD-10-CM | POA: Diagnosis not present

## 2014-01-01 DIAGNOSIS — R234 Changes in skin texture: Secondary | ICD-10-CM

## 2014-01-01 DIAGNOSIS — L988 Other specified disorders of the skin and subcutaneous tissue: Secondary | ICD-10-CM

## 2014-01-01 DIAGNOSIS — M79606 Pain in leg, unspecified: Secondary | ICD-10-CM

## 2014-01-01 DIAGNOSIS — L84 Corns and callosities: Secondary | ICD-10-CM | POA: Diagnosis not present

## 2014-01-01 NOTE — Progress Notes (Signed)
Subjective: 53 year old diabetic patient presents for follow up on cracked lesions on both heels.  Objective:  Resolved cracks and fissures this time.  Thick dystrophic nails x 10.  Both feet are extremely dry and scaly top and bottom.  Assessment: Onychomycosis x 10.  Diabetic neuropathy lower limbs.  Pain in lower limb.  Plan: All nails debrided.  Return in 3 weeks to check on heels.

## 2014-01-01 NOTE — Patient Instructions (Signed)
Seen for hypertrophic calluses.  All calluses debrided. Return in 3 weeks or as needed.  

## 2014-01-22 ENCOUNTER — Ambulatory Visit (INDEPENDENT_AMBULATORY_CARE_PROVIDER_SITE_OTHER): Payer: BLUE CROSS/BLUE SHIELD | Admitting: Podiatry

## 2014-01-22 ENCOUNTER — Encounter: Payer: Self-pay | Admitting: Podiatry

## 2014-01-22 VITALS — BP 164/69 | HR 65

## 2014-01-22 DIAGNOSIS — L84 Corns and callosities: Secondary | ICD-10-CM

## 2014-01-22 DIAGNOSIS — R234 Changes in skin texture: Secondary | ICD-10-CM

## 2014-01-22 DIAGNOSIS — L989 Disorder of the skin and subcutaneous tissue, unspecified: Secondary | ICD-10-CM

## 2014-01-22 DIAGNOSIS — E0841 Diabetes mellitus due to underlying condition with diabetic mononeuropathy: Secondary | ICD-10-CM

## 2014-01-22 NOTE — Patient Instructions (Signed)
Seen for hypertrophic calluses.  All calluses debrided. Return in 3 weeks or as needed.

## 2014-01-22 NOTE — Progress Notes (Signed)
Subjective: 53 year old diabetic patient presents for follow up on cracked lesions on both heels.   Objective:  Decreased and improved cracks and fissures this time.  Thick dystrophic nails x 10.  Both feet are extremely dry and scaly top and bottom.   Assessment: Onychomycosis x 10.  Diabetic neuropathy lower limbs.  Pain in lower limb.   Plan: All fissured calluses debrided.  Return in 3 weeks to check on heels.

## 2014-02-21 ENCOUNTER — Encounter: Payer: Self-pay | Admitting: Podiatry

## 2014-02-21 ENCOUNTER — Ambulatory Visit (INDEPENDENT_AMBULATORY_CARE_PROVIDER_SITE_OTHER): Payer: BLUE CROSS/BLUE SHIELD | Admitting: Podiatry

## 2014-02-21 VITALS — BP 165/67 | HR 66

## 2014-02-21 DIAGNOSIS — R234 Changes in skin texture: Secondary | ICD-10-CM

## 2014-02-21 DIAGNOSIS — L989 Disorder of the skin and subcutaneous tissue, unspecified: Secondary | ICD-10-CM

## 2014-02-21 DIAGNOSIS — M79606 Pain in leg, unspecified: Secondary | ICD-10-CM

## 2014-02-21 DIAGNOSIS — E0841 Diabetes mellitus due to underlying condition with diabetic mononeuropathy: Secondary | ICD-10-CM

## 2014-02-21 NOTE — Patient Instructions (Signed)
Debrided painful calluses. Return in 3 weeks.

## 2014-02-21 NOTE — Progress Notes (Signed)
Subjective: 53 year old diabetic patient presents for follow up on cracked lesions on both heels.   Objective:  Thick calluses and dry skin with cracked heel bilateral.  Thick dystrophic nails x 10.  Both feet are extremely dry and scaly top and bottom.   Assessment: Fissured heel. Calluses  Diabetic neuropathy lower limbs.  Pain in lower limb.   Plan: All fissured calluses debrided.  Return in 3 weeks to check on heels.

## 2014-03-14 ENCOUNTER — Encounter: Payer: Self-pay | Admitting: Podiatry

## 2014-03-14 ENCOUNTER — Ambulatory Visit (INDEPENDENT_AMBULATORY_CARE_PROVIDER_SITE_OTHER): Payer: BLUE CROSS/BLUE SHIELD | Admitting: Podiatry

## 2014-03-14 VITALS — BP 158/67 | HR 65

## 2014-03-14 DIAGNOSIS — L989 Disorder of the skin and subcutaneous tissue, unspecified: Secondary | ICD-10-CM

## 2014-03-14 DIAGNOSIS — E0840 Diabetes mellitus due to underlying condition with diabetic neuropathy, unspecified: Secondary | ICD-10-CM | POA: Insufficient documentation

## 2014-03-14 DIAGNOSIS — L851 Acquired keratosis [keratoderma] palmaris et plantaris: Secondary | ICD-10-CM

## 2014-03-14 DIAGNOSIS — B351 Tinea unguium: Secondary | ICD-10-CM

## 2014-03-14 DIAGNOSIS — R234 Changes in skin texture: Secondary | ICD-10-CM

## 2014-03-14 DIAGNOSIS — E0842 Diabetes mellitus due to underlying condition with diabetic polyneuropathy: Secondary | ICD-10-CM

## 2014-03-14 DIAGNOSIS — M79606 Pain in leg, unspecified: Secondary | ICD-10-CM

## 2014-03-14 NOTE — Progress Notes (Signed)
Subjective: 53 year old diabetic patient presents for follow up on cracked lesions on both heels.   Objective:  Thick calluses and dry skin with cracked heel bilateral.  Thick dystrophic nails x 10.  Both feet are extremely dry and scaly top and bottom.   Assessment: Fissured heel. Calluses multiple plantar.  Diabetic neuropathy lower limbs.  Pain in lower limb.   Plan: Soaked in Aloe solution for 20 minutes.  All fissured calluses debrided. All nails debrided. Return in 3 weeks to check on heels.

## 2014-03-14 NOTE — Patient Instructions (Signed)
Seen for hypertrophic nails and calluses. All nails and calluses debrided. Return in 1 month or as needed.  

## 2014-04-15 ENCOUNTER — Ambulatory Visit: Payer: BLUE CROSS/BLUE SHIELD | Admitting: Podiatry

## 2014-05-16 ENCOUNTER — Encounter: Payer: Self-pay | Admitting: Podiatry

## 2014-05-16 ENCOUNTER — Ambulatory Visit (INDEPENDENT_AMBULATORY_CARE_PROVIDER_SITE_OTHER): Payer: BLUE CROSS/BLUE SHIELD | Admitting: Podiatry

## 2014-05-16 VITALS — BP 127/54 | HR 66

## 2014-05-16 DIAGNOSIS — M79606 Pain in leg, unspecified: Secondary | ICD-10-CM | POA: Diagnosis not present

## 2014-05-16 DIAGNOSIS — L989 Disorder of the skin and subcutaneous tissue, unspecified: Secondary | ICD-10-CM

## 2014-05-16 DIAGNOSIS — R234 Changes in skin texture: Secondary | ICD-10-CM

## 2014-05-16 DIAGNOSIS — B351 Tinea unguium: Secondary | ICD-10-CM | POA: Diagnosis not present

## 2014-05-16 DIAGNOSIS — E0842 Diabetes mellitus due to underlying condition with diabetic polyneuropathy: Secondary | ICD-10-CM

## 2014-05-16 NOTE — Progress Notes (Signed)
Subjective: 54 year old diabetic patient presents complaining of pain under ball of left foot and thick deformed nails.    Objective:  Thick calluses and dry skin with cracked heel bilateral.  Thick hypertrophic callus under 5th MPJ left foot painful.  Thick dystrophic nails x 10.  Both feet are extremely dry and scaly top and bottom.   Assessment: Fissured heel. Calluses multiple plantar, pain on left foot callus.  Diabetic neuropathy lower limbs.  Pain in lower limb.   Plan: Soaked in Aloe solution for 20 minutes.  All fissured calluses debrided. All nails debrided. Return in 3 weeks to check on heels.

## 2014-05-16 NOTE — Patient Instructions (Signed)
Seen for hypertrophic nails and painful calluses. All nails and calluses debrided. Return in 3 weeks.

## 2014-06-06 ENCOUNTER — Ambulatory Visit: Payer: BC Managed Care – PPO | Admitting: Podiatry

## 2014-06-13 ENCOUNTER — Ambulatory Visit (INDEPENDENT_AMBULATORY_CARE_PROVIDER_SITE_OTHER): Payer: BLUE CROSS/BLUE SHIELD | Admitting: Podiatry

## 2014-06-13 ENCOUNTER — Encounter: Payer: Self-pay | Admitting: Podiatry

## 2014-06-13 VITALS — BP 141/68 | HR 65

## 2014-06-13 DIAGNOSIS — M79606 Pain in leg, unspecified: Secondary | ICD-10-CM

## 2014-06-13 DIAGNOSIS — L989 Disorder of the skin and subcutaneous tissue, unspecified: Secondary | ICD-10-CM | POA: Diagnosis not present

## 2014-06-13 DIAGNOSIS — R234 Changes in skin texture: Secondary | ICD-10-CM

## 2014-06-13 DIAGNOSIS — E0842 Diabetes mellitus due to underlying condition with diabetic polyneuropathy: Secondary | ICD-10-CM | POA: Diagnosis not present

## 2014-06-13 NOTE — Progress Notes (Signed)
Subjective: 54 year old diabetic patient presents complaining of painful cracking calluses.  Objective:  Thick calluses and dry skin with cracked heel bilateral.  Thick hypertrophic callus under 5th MPJ left foot painful.  Thick dystrophic nails x 10.  Both feet are extremely dry and scaly top and bottom.   Assessment: Fissured heel. Calluses multiple plantar, pain on left foot callus.  Diabetic neuropathy lower limbs.  Pain in lower limb.   Plan: Soaked in Aloe solution for 20 minutes.  All fissured calluses debrided. All nails debrided. Return in 3 weeks to check on heels.

## 2014-06-13 NOTE — Patient Instructions (Signed)
Seen for fissured calluses. All debrided. Return in 3 weeks.

## 2014-07-04 ENCOUNTER — Ambulatory Visit (INDEPENDENT_AMBULATORY_CARE_PROVIDER_SITE_OTHER): Payer: BLUE CROSS/BLUE SHIELD | Admitting: Podiatry

## 2014-07-04 ENCOUNTER — Encounter: Payer: Self-pay | Admitting: Podiatry

## 2014-07-04 VITALS — BP 146/63 | HR 60

## 2014-07-04 DIAGNOSIS — L84 Corns and callosities: Secondary | ICD-10-CM

## 2014-07-04 DIAGNOSIS — E0842 Diabetes mellitus due to underlying condition with diabetic polyneuropathy: Secondary | ICD-10-CM | POA: Diagnosis not present

## 2014-07-04 DIAGNOSIS — L989 Disorder of the skin and subcutaneous tissue, unspecified: Secondary | ICD-10-CM | POA: Diagnosis not present

## 2014-07-04 DIAGNOSIS — R234 Changes in skin texture: Secondary | ICD-10-CM

## 2014-07-04 NOTE — Progress Notes (Signed)
Subjective: 54 year old diabetic patient presents complaining of painful cracking calluses. Been hurting on left foot under the callus, 5th MPJ plantar.  Objective:  Thick calluses and dry skin with cracked heel bilateral.  Thick hypertrophic callus under 5th MPJ left foot painful.  Thick dystrophic nails x 10.  Both feet are extremely dry and scaly top and bottom.  Bled left heel after debridement of fissured lesion.  Assessment: Fissured heel. Bleeding from debridement left heel. Calluses multiple plantar, pain on left foot callus.  Diabetic neuropathy lower limbs.  Pain in lower limb.   Plan: Soaked in Aloe solution for 20 minutes.  All fissured calluses debrided. All nails debrided. Bleeding left heel area cleansed with Betadine and compression dressing applied with Amerigel.  Patient is to change dressing every other day and return in one week for follow up.

## 2014-07-04 NOTE — Patient Instructions (Signed)
Seen for fissured calluses. All debrided. Bleeding lesion dressed on left heel. Do dressing change every other day. Return in one week.

## 2014-07-11 ENCOUNTER — Encounter: Payer: Self-pay | Admitting: Podiatry

## 2014-07-11 ENCOUNTER — Ambulatory Visit (INDEPENDENT_AMBULATORY_CARE_PROVIDER_SITE_OTHER): Payer: BC Managed Care – PPO | Admitting: Podiatry

## 2014-07-11 DIAGNOSIS — L989 Disorder of the skin and subcutaneous tissue, unspecified: Secondary | ICD-10-CM

## 2014-07-11 DIAGNOSIS — R234 Changes in skin texture: Secondary | ICD-10-CM

## 2014-07-11 NOTE — Patient Instructions (Addendum)
Left heel bled lesion healed well. Return in 3 weeks for follow up on cracked calluses.

## 2014-07-11 NOTE — Progress Notes (Signed)
Follow up on bled left heel area after trimming. Wound has healed and dry. Return for regular appointment for fissured callus.

## 2014-08-01 ENCOUNTER — Ambulatory Visit: Payer: BC Managed Care – PPO | Admitting: Podiatry

## 2014-08-08 ENCOUNTER — Encounter: Payer: Self-pay | Admitting: Podiatry

## 2014-08-08 ENCOUNTER — Ambulatory Visit (INDEPENDENT_AMBULATORY_CARE_PROVIDER_SITE_OTHER): Payer: BC Managed Care – PPO | Admitting: Podiatry

## 2014-08-08 VITALS — BP 173/74 | HR 62

## 2014-08-08 DIAGNOSIS — E0842 Diabetes mellitus due to underlying condition with diabetic polyneuropathy: Secondary | ICD-10-CM | POA: Diagnosis not present

## 2014-08-08 DIAGNOSIS — M79606 Pain in leg, unspecified: Secondary | ICD-10-CM | POA: Diagnosis not present

## 2014-08-08 DIAGNOSIS — B351 Tinea unguium: Secondary | ICD-10-CM

## 2014-08-08 DIAGNOSIS — L84 Corns and callosities: Secondary | ICD-10-CM

## 2014-08-08 DIAGNOSIS — R234 Changes in skin texture: Secondary | ICD-10-CM

## 2014-08-08 NOTE — Patient Instructions (Signed)
Seen for painful cracking calluses. All nails and calluses trimmed. Return in one month.

## 2014-08-08 NOTE — Progress Notes (Signed)
Subjective: 53 year old diabetic patient presents complaining of painful cracking calluses. No new changes since the last visit.   Objective:  Thick calluses and dry skin with cracked heel bilateral.  Thick hypertrophic callus under 5th MPJ left foot painful.  Thick dystrophic nails x 10.  Both feet are extremely dry and scaly top and bottom.   Assessment: Fissured heel. Calluses multiple plantar, pain on left foot callus.  Diabetic neuropathy lower limbs.  Pain in lower limb.   Plan: Soaked in Aloe solution for 20 minutes.  All fissured calluses debrided. All nails debrided. Return in 3 weeks to check on heels. 

## 2014-09-02 ENCOUNTER — Ambulatory Visit: Payer: BC Managed Care – PPO | Admitting: Podiatry

## 2014-09-05 ENCOUNTER — Ambulatory Visit (INDEPENDENT_AMBULATORY_CARE_PROVIDER_SITE_OTHER): Payer: BC Managed Care – PPO | Admitting: Podiatry

## 2014-09-05 ENCOUNTER — Encounter: Payer: Self-pay | Admitting: Podiatry

## 2014-09-05 VITALS — BP 160/65 | HR 61

## 2014-09-05 DIAGNOSIS — E0842 Diabetes mellitus due to underlying condition with diabetic polyneuropathy: Secondary | ICD-10-CM

## 2014-09-05 DIAGNOSIS — L989 Disorder of the skin and subcutaneous tissue, unspecified: Secondary | ICD-10-CM

## 2014-09-05 DIAGNOSIS — R234 Changes in skin texture: Secondary | ICD-10-CM

## 2014-09-05 NOTE — Patient Instructions (Signed)
Seen for fissured calluses. All calluses debrided. Return in 6 weeks or as needed.

## 2014-09-05 NOTE — Progress Notes (Signed)
Subjective: 53 year old diabetic patient presents complaining of painful cracking calluses. No new changes since the last visit.   Objective:  Thick calluses and dry skin with cracked heel bilateral.  Thick hypertrophic callus under 5th MPJ left foot painful.  Thick dystrophic nails x 10.  Both feet are extremely dry and scaly top and bottom.   Assessment: Fissured heel. Calluses multiple plantar, pain on left foot callus.  Diabetic neuropathy lower limbs.  Pain in lower limb.   Plan: Soaked in Aloe solution for 20 minutes.  All fissured calluses debrided. All nails debrided. Return in 3 weeks to check on heels. 

## 2014-09-29 ENCOUNTER — Other Ambulatory Visit: Payer: Self-pay

## 2014-10-15 ENCOUNTER — Encounter: Payer: Self-pay | Admitting: Podiatry

## 2014-10-15 ENCOUNTER — Ambulatory Visit (INDEPENDENT_AMBULATORY_CARE_PROVIDER_SITE_OTHER): Payer: BC Managed Care – PPO | Admitting: Podiatry

## 2014-10-15 VITALS — BP 154/61 | HR 62

## 2014-10-15 DIAGNOSIS — R234 Changes in skin texture: Secondary | ICD-10-CM

## 2014-10-15 DIAGNOSIS — M79606 Pain in leg, unspecified: Secondary | ICD-10-CM

## 2014-10-15 DIAGNOSIS — L989 Disorder of the skin and subcutaneous tissue, unspecified: Secondary | ICD-10-CM

## 2014-10-15 DIAGNOSIS — B351 Tinea unguium: Secondary | ICD-10-CM

## 2014-10-15 DIAGNOSIS — E0842 Diabetes mellitus due to underlying condition with diabetic polyneuropathy: Secondary | ICD-10-CM

## 2014-10-15 NOTE — Progress Notes (Signed)
Subjective: 54 year old diabetic patient presents complaining of painful cracking calluses. No new changes since the last visit.   Objective:  Thick calluses and dry skin with cracked heel bilateral.  Thick hypertrophic callus under 5th MPJ left foot painful.  Thick dystrophic nails x 10.  Both feet are extremely dry and scaly top and bottom.   Assessment: Fissured heel. Calluses multiple plantar, pain on left foot callus.  Diabetic neuropathy lower limbs.  Pain in lower limb.   Plan: Soaked in Aloe solution for 20 minutes.  All fissured calluses debrided. All nails debrided. Return in 3 weeks to check on heels.

## 2014-10-15 NOTE — Patient Instructions (Signed)
Seen for hypertrophic nails and calluses. All nails and calluses debrided. Return in 1 month or as needed.

## 2014-11-14 ENCOUNTER — Ambulatory Visit: Payer: BC Managed Care – PPO | Admitting: Podiatry

## 2014-11-21 ENCOUNTER — Ambulatory Visit: Payer: BC Managed Care – PPO | Admitting: Podiatry

## 2014-11-25 ENCOUNTER — Ambulatory Visit (INDEPENDENT_AMBULATORY_CARE_PROVIDER_SITE_OTHER): Payer: BC Managed Care – PPO | Admitting: Podiatry

## 2014-11-25 ENCOUNTER — Encounter: Payer: Self-pay | Admitting: Podiatry

## 2014-11-25 VITALS — BP 149/72 | HR 68

## 2014-11-25 DIAGNOSIS — R234 Changes in skin texture: Secondary | ICD-10-CM

## 2014-11-25 DIAGNOSIS — L989 Disorder of the skin and subcutaneous tissue, unspecified: Secondary | ICD-10-CM

## 2014-11-25 DIAGNOSIS — M79606 Pain in leg, unspecified: Secondary | ICD-10-CM

## 2014-11-25 DIAGNOSIS — E0842 Diabetes mellitus due to underlying condition with diabetic polyneuropathy: Secondary | ICD-10-CM

## 2014-11-25 NOTE — Patient Instructions (Signed)
Seen for hypertrophic calluses. All calluses debrided. Return in 6 weeks as needed.  

## 2014-11-25 NOTE — Progress Notes (Signed)
Subjective: 54 year old diabetic patient presents follow up on cracking calluses.  No new changes since the last visit.   Objective:  Thick calluses and dry skin with cracked heel bilateral, superficial. .  Thick hypertrophic callus under 5th MPJ left foot painful.  Both feet are extremely dry and scaly top and bottom.   Assessment: Fissured heel, improved. Calluses multiple plantar, pain on left foot callus.  Diabetic neuropathy lower limbs.  Pain in lower limb.   Plan: Soaked in Aloe solution for 20 minutes.  All fissured calluses debrided. All nails debrided. Return in 6 weeks.

## 2015-01-06 ENCOUNTER — Ambulatory Visit: Payer: BC Managed Care – PPO | Admitting: Podiatry

## 2015-01-07 ENCOUNTER — Encounter: Payer: Self-pay | Admitting: Podiatry

## 2015-01-07 ENCOUNTER — Ambulatory Visit (INDEPENDENT_AMBULATORY_CARE_PROVIDER_SITE_OTHER): Payer: BC Managed Care – PPO | Admitting: Podiatry

## 2015-01-07 VITALS — BP 171/68 | HR 70

## 2015-01-07 DIAGNOSIS — M79606 Pain in leg, unspecified: Secondary | ICD-10-CM

## 2015-01-07 DIAGNOSIS — R234 Changes in skin texture: Secondary | ICD-10-CM

## 2015-01-07 DIAGNOSIS — L989 Disorder of the skin and subcutaneous tissue, unspecified: Secondary | ICD-10-CM

## 2015-01-07 DIAGNOSIS — B351 Tinea unguium: Secondary | ICD-10-CM

## 2015-01-07 DIAGNOSIS — E0842 Diabetes mellitus due to underlying condition with diabetic polyneuropathy: Secondary | ICD-10-CM

## 2015-01-07 NOTE — Patient Instructions (Signed)
Seen for hypertrophic nails and calluses. All nails and calluses debrided. Return in 6 weeks or as needed.

## 2015-01-08 NOTE — Progress Notes (Signed)
Subjective: 54 year old diabetic patient presents for painful cracking calluses and thick toe nails.  No new changes since the last visit.   Objective:  Thick calluses and dry skin with cracked heel bilateral, superficial. .  Thick hypertrophic callus under 5th MPJ left foot painful.  Both feet are extremely dry and scaly top and bottom.  All nails are thick, yellow and hypertrophic. Skin on both feet and legs are thin, shiny and pealing with random scabs. All digits are hyperextended on lesser digits.  Assessment: Fissured heel with thick calluses. Calluses multiple plantar, pain on left foot callus.  Onychomycosis x 10. Diabetic neuropathy lower limbs.  Pain in lower limb.   Plan: Soaked in Aloe solution for 20 minutes.  All fissured calluses debrided. All nails debrided. Return in 6 weeks.

## 2015-03-04 ENCOUNTER — Ambulatory Visit (INDEPENDENT_AMBULATORY_CARE_PROVIDER_SITE_OTHER): Payer: BC Managed Care – PPO | Admitting: Podiatry

## 2015-03-04 ENCOUNTER — Encounter: Payer: Self-pay | Admitting: Podiatry

## 2015-03-04 VITALS — BP 161/65 | HR 70

## 2015-03-04 DIAGNOSIS — E0842 Diabetes mellitus due to underlying condition with diabetic polyneuropathy: Secondary | ICD-10-CM

## 2015-03-04 DIAGNOSIS — R234 Changes in skin texture: Secondary | ICD-10-CM

## 2015-03-04 DIAGNOSIS — L989 Disorder of the skin and subcutaneous tissue, unspecified: Secondary | ICD-10-CM

## 2015-03-04 DIAGNOSIS — L97501 Non-pressure chronic ulcer of other part of unspecified foot limited to breakdown of skin: Secondary | ICD-10-CM

## 2015-03-04 DIAGNOSIS — M79673 Pain in unspecified foot: Secondary | ICD-10-CM

## 2015-03-04 DIAGNOSIS — L97509 Non-pressure chronic ulcer of other part of unspecified foot with unspecified severity: Secondary | ICD-10-CM | POA: Insufficient documentation

## 2015-03-04 DIAGNOSIS — L97521 Non-pressure chronic ulcer of other part of left foot limited to breakdown of skin: Secondary | ICD-10-CM

## 2015-03-04 NOTE — Patient Instructions (Signed)
New lesion under left foot. Debrided and dressed. Return in one week.

## 2015-03-04 NOTE — Progress Notes (Signed)
Subjective: 54 year old diabetic patient presents for painful lesion on left foot. He was on a trip and been walking on sands.  Objective:  Ulcerated thick callus under 5th MPJ left foot. Both feet are extremely dry and scaly top and bottom.  All nails are thick, yellow and hypertrophic. Skin on both feet and legs are thin, shiny and pealing with random scabs. All digits are hyperextended on lesser digits.  Assessment: Ulcerated left foot under 5th MPJ limited to skin damage.  Thick dry calluses under and around both heels. Diabetic neuropathy lower limbs.  Pain in lower limb.   Plan: Soaked in Aloe solution for 20 minutes.  Ulcerated calluses debrided. Aperture pad dressing applied. Return in 1 week. .Marland Kitchen

## 2015-03-11 ENCOUNTER — Encounter: Payer: Self-pay | Admitting: Podiatry

## 2015-03-11 ENCOUNTER — Ambulatory Visit (INDEPENDENT_AMBULATORY_CARE_PROVIDER_SITE_OTHER): Payer: BC Managed Care – PPO | Admitting: Podiatry

## 2015-03-11 VITALS — BP 131/68 | HR 70

## 2015-03-11 DIAGNOSIS — E0842 Diabetes mellitus due to underlying condition with diabetic polyneuropathy: Secondary | ICD-10-CM

## 2015-03-11 DIAGNOSIS — L97501 Non-pressure chronic ulcer of other part of unspecified foot limited to breakdown of skin: Secondary | ICD-10-CM

## 2015-03-11 NOTE — Progress Notes (Signed)
Subjective: 54 year old diabetic patient presents for follow up on new lesion on left foot.  The lesion was developed while he was on a trip two weeks ago.   Objective:  Ulcerated lesion is improving with minimum opening. 0.5cm under 5th MPJ with surrounded callus left foot. Both feet are extremely dry and scaly top and bottom.  Skin on both feet and legs are thin, shiny and pealing with random scabs. All digits are hyperextended on lesser digits.  Assessment: Ulcerated left foot under 5th MPJ limited to skin damage improving with decreased size, 0.5cm in diameter opening surrounded with thick callus.   Diabetic neuropathy lower limbs.  Pain in lower limb.   Plan: Ulcerated calluses debrided. Aperture pad dressing applied. Return in 1 week.

## 2015-03-11 NOTE — Patient Instructions (Signed)
Seen for new ulcer left foot. Debrided and padded. Return in one week.

## 2015-03-18 ENCOUNTER — Ambulatory Visit (INDEPENDENT_AMBULATORY_CARE_PROVIDER_SITE_OTHER): Payer: BC Managed Care – PPO | Admitting: Podiatry

## 2015-03-18 ENCOUNTER — Encounter: Payer: Self-pay | Admitting: Podiatry

## 2015-03-18 DIAGNOSIS — L97501 Non-pressure chronic ulcer of other part of unspecified foot limited to breakdown of skin: Secondary | ICD-10-CM

## 2015-03-18 DIAGNOSIS — E0842 Diabetes mellitus due to underlying condition with diabetic polyneuropathy: Secondary | ICD-10-CM

## 2015-03-18 NOTE — Patient Instructions (Addendum)
Follow up on left foot ulcer. Improved lesion debrided and padded. Return in 2 weeks.

## 2015-03-18 NOTE — Progress Notes (Signed)
Subjective: 54 year old diabetic patient presents for follow up on new lesion on left foot.  The lesion was developed while he was on a trip two weeks ago.  Denies any pain.   Objective:  Ulcerated lesion is improving under 5th MPJ with closed off center and surrounded callus left foot. Both feet are extremely dry and scaly top and bottom.  Skin on both feet and legs are thin, shiny and pealing with random scabs. All digits are hyperextended on lesser digits.  Assessment: Ulcerated left foot under 5th MPJ limited to skin damage improving with closed off centerand surrounded thick callus.  Diabetic neuropathy lower limbs.  Pain in lower limb.   Plan: Ulcerated calluses debrided.  Aperture pad dressing with Amerigel applied. Return in 2 week

## 2015-04-08 ENCOUNTER — Ambulatory Visit (INDEPENDENT_AMBULATORY_CARE_PROVIDER_SITE_OTHER): Payer: BC Managed Care – PPO | Admitting: Podiatry

## 2015-04-08 ENCOUNTER — Encounter: Payer: Self-pay | Admitting: Podiatry

## 2015-04-08 DIAGNOSIS — E0842 Diabetes mellitus due to underlying condition with diabetic polyneuropathy: Secondary | ICD-10-CM | POA: Diagnosis not present

## 2015-04-08 DIAGNOSIS — L97501 Non-pressure chronic ulcer of other part of unspecified foot limited to breakdown of skin: Secondary | ICD-10-CM | POA: Diagnosis not present

## 2015-04-08 DIAGNOSIS — R234 Changes in skin texture: Secondary | ICD-10-CM

## 2015-04-08 DIAGNOSIS — L989 Disorder of the skin and subcutaneous tissue, unspecified: Secondary | ICD-10-CM

## 2015-04-08 NOTE — Progress Notes (Signed)
Subjective: 55 year old diabetic patient presents for follow up on new lesion on left foot.  The lesion was developed while he was on a trip 4 weeks ago.  Denies any pain.   Objective:  Ulcerated lesion is improving under 5th MPJ with closed off center and surrounded callus left foot. Both feet are extremely dry and scaly top and bottom.  Skin on both feet and legs are thin, shiny and pealing with random scabs. All digits are hyperextended on lesser digits.  Assessment: Ulcerated left foot under 5th MPJ limited to skin damage improving with closed off centerand surrounded thick callus.  Diabetic neuropathy lower limbs.  Pain in lower limb.   Plan: Ulcerated calluses debrided.  Aperture pad dressing with Amerigel applied. Return in 3 week

## 2015-04-08 NOTE — Patient Instructions (Signed)
Seen for ulcer on left foot. Improvement seen. Return in 3 weeks.

## 2015-04-17 ENCOUNTER — Other Ambulatory Visit: Payer: Self-pay | Admitting: Anesthesiology

## 2015-04-17 DIAGNOSIS — M545 Low back pain: Secondary | ICD-10-CM

## 2015-04-22 ENCOUNTER — Ambulatory Visit
Admission: RE | Admit: 2015-04-22 | Discharge: 2015-04-22 | Disposition: A | Discharge status: Home / Self Care | Payer: BC Managed Care – PPO | Source: Ambulatory Visit | Attending: Anesthesiology | Admitting: Anesthesiology

## 2015-04-22 DIAGNOSIS — M545 Low back pain: Secondary | ICD-10-CM

## 2015-04-22 MED ORDER — GADOBENATE DIMEGLUMINE 529 MG/ML IV SOLN
20.0000 mL | Freq: Once | INTRAVENOUS | Status: AC | PRN
  Administered 2015-04-22: 20 mL via INTRAVENOUS

## 2015-04-29 ENCOUNTER — Encounter: Payer: Self-pay | Admitting: Podiatry

## 2015-04-29 ENCOUNTER — Ambulatory Visit (INDEPENDENT_AMBULATORY_CARE_PROVIDER_SITE_OTHER): Payer: BC Managed Care – PPO | Admitting: Podiatry

## 2015-04-29 VITALS — BP 151/58 | HR 71

## 2015-04-29 DIAGNOSIS — E0842 Diabetes mellitus due to underlying condition with diabetic polyneuropathy: Secondary | ICD-10-CM | POA: Diagnosis not present

## 2015-04-29 DIAGNOSIS — M79606 Pain in leg, unspecified: Secondary | ICD-10-CM | POA: Diagnosis not present

## 2015-04-29 DIAGNOSIS — R234 Changes in skin texture: Secondary | ICD-10-CM

## 2015-04-29 DIAGNOSIS — L989 Disorder of the skin and subcutaneous tissue, unspecified: Secondary | ICD-10-CM

## 2015-04-29 DIAGNOSIS — B351 Tinea unguium: Secondary | ICD-10-CM

## 2015-04-29 NOTE — Progress Notes (Signed)
Subjective: 55 year old diabetic patient presents for follow up on new lesion on left foot.  The lesion was developed while he was on a vacation.  Has cracked and fissured skin on both lower limbs.  Objective:  Ulcerated lesion has improved under 5th MPJ with closed off center and surrounded callus left foot. Both feet and legs are extremely dry and scaly top and bottom.  Skin on both feet and legs are thin, shiny and pealing with random scabs. All digits are hyperextended on lesser digits. All nails are hypertrophic. Pedal pulses are not palpable.  Loss of tactile sensations on both feet.   Assessment: Ulcerated left foot under 5th MPJ limited to skin damage improving with closed off centerand surrounded thick callus.  Onychomycosis x 10. Diabetic neuropathy lower limbs.  PVD bilateral. Pain in lower limb.   Plan: Both feet soaked for 10 minutes.  Ulcerated calluses debrided. All fissured calluses debrided on both heels. All nails debrided.  Vitamin A cream applied to both lower limbs and feet. Return in 1 month.

## 2015-04-29 NOTE — Patient Instructions (Signed)
Seen for cracked calluses. All debrided, nails and calluses. Return in one month.

## 2015-06-09 ENCOUNTER — Ambulatory Visit: Payer: BC Managed Care – PPO | Admitting: Podiatry

## 2015-06-16 ENCOUNTER — Encounter: Payer: Self-pay | Admitting: Podiatry

## 2015-06-16 ENCOUNTER — Ambulatory Visit (INDEPENDENT_AMBULATORY_CARE_PROVIDER_SITE_OTHER): Payer: BC Managed Care – PPO | Admitting: Podiatry

## 2015-06-16 DIAGNOSIS — L97501 Non-pressure chronic ulcer of other part of unspecified foot limited to breakdown of skin: Secondary | ICD-10-CM

## 2015-06-16 DIAGNOSIS — E0842 Diabetes mellitus due to underlying condition with diabetic polyneuropathy: Secondary | ICD-10-CM | POA: Diagnosis not present

## 2015-06-16 DIAGNOSIS — L989 Disorder of the skin and subcutaneous tissue, unspecified: Secondary | ICD-10-CM | POA: Diagnosis not present

## 2015-06-16 DIAGNOSIS — R234 Changes in skin texture: Secondary | ICD-10-CM

## 2015-06-16 NOTE — Patient Instructions (Signed)
Fissured heels and calluses debrided. Return in 6 weeks.

## 2015-06-16 NOTE — Progress Notes (Signed)
Subjective: 55 year old diabetic patient presents for follow up on cracking callused lesions on both feet.   Objective:  Thick dystrophic callus under old ulcer site 5th MPJ. Both feet and legs are extremely dry and scaly top and bottom.  Both heels have cracking calluses.  Skin on both feet and legs are thin, shiny and pealing with random scabs. All digits are hyperextended on lesser digits. All nails are hypertrophic. Pedal pulses are not palpable.  Loss of tactile sensations on both feet.   Assessment: Callus under old ulcer site 5th MPJ left.  Cracking callus under both heels. Onychomycosis x 10. Diabetic neuropathy lower limbs.  PVD bilateral. Pain in lower limb.   Plan: Both feet soaked for 10 minutes.  Ulcerated calluses debrided. All fissured calluses debrided on both heels. All nails debrided.  Vitamin A cream applied to both lower limbs and feet. Return in 6 weeks.

## 2015-07-28 ENCOUNTER — Ambulatory Visit (INDEPENDENT_AMBULATORY_CARE_PROVIDER_SITE_OTHER): Payer: BC Managed Care – PPO | Admitting: Podiatry

## 2015-07-28 ENCOUNTER — Encounter: Payer: Self-pay | Admitting: Podiatry

## 2015-07-28 VITALS — BP 156/69 | HR 69

## 2015-07-28 DIAGNOSIS — L97501 Non-pressure chronic ulcer of other part of unspecified foot limited to breakdown of skin: Secondary | ICD-10-CM

## 2015-07-28 DIAGNOSIS — E0841 Diabetes mellitus due to underlying condition with diabetic mononeuropathy: Secondary | ICD-10-CM

## 2015-07-28 DIAGNOSIS — L989 Disorder of the skin and subcutaneous tissue, unspecified: Secondary | ICD-10-CM | POA: Diagnosis not present

## 2015-07-28 DIAGNOSIS — R234 Changes in skin texture: Secondary | ICD-10-CM

## 2015-07-28 NOTE — Patient Instructions (Signed)
Seen for hypertrophic nails, fissured calluses on both heels. Soaked, debrided all cracking, fissured calluses and nails. Return in 4 weeks or as needed.

## 2015-07-28 NOTE — Progress Notes (Signed)
Subjective: 55 year old diabetic patient presents for follow up on cracking, pre ulcerative callused lesions on both feet.   Objective:  Dermatologic: Thick broad callus under old ulcer site 5th MPJ left foot. Fissured cracking calluses on plantar posterior heel bilateral. Both feet and legs are extremely dry and scaly top and bottom.  Skin on both feet and legs are thin, shiny and pealing with random scabs. Hypertrophic nails x 10. Vascular: Pedal pulses are not palpable bilateral.  Orthopedic: All digits are hyperextended on lesser digits. Neurologic: Paresthesia with loss of normal tactile sensations on both feet.   Assessment: Callus under old ulcer site 5th MPJ left.  Fissured and cracking callus under both heels. Onychomycosis x 10. Diabetic neuropathy lower limbs.  PVD bilateral. Pain in lower limb.   Plan: Both feet soaked for 10 minutes.  Ulcerated calluses debrided.  All fissured calluses debrided on both heels. All nails debrided.  Vitamin A cream applied to both lower limbs and feet. Return in 6 weeks.

## 2015-08-27 ENCOUNTER — Ambulatory Visit (INDEPENDENT_AMBULATORY_CARE_PROVIDER_SITE_OTHER): Payer: BC Managed Care – PPO | Admitting: Podiatry

## 2015-08-27 ENCOUNTER — Encounter: Payer: Self-pay | Admitting: Podiatry

## 2015-08-27 VITALS — BP 156/68 | HR 69

## 2015-08-27 DIAGNOSIS — E0842 Diabetes mellitus due to underlying condition with diabetic polyneuropathy: Secondary | ICD-10-CM

## 2015-08-27 DIAGNOSIS — M79673 Pain in unspecified foot: Secondary | ICD-10-CM | POA: Diagnosis not present

## 2015-08-27 DIAGNOSIS — L989 Disorder of the skin and subcutaneous tissue, unspecified: Secondary | ICD-10-CM | POA: Diagnosis not present

## 2015-08-27 DIAGNOSIS — B351 Tinea unguium: Secondary | ICD-10-CM | POA: Diagnosis not present

## 2015-08-27 DIAGNOSIS — R234 Changes in skin texture: Secondary | ICD-10-CM

## 2015-08-27 NOTE — Progress Notes (Signed)
Subjective: 55 year old diabetic patient presents for follow up on cracking, pre ulcerative callused lesions on both feet.   Objective:  Dermatologic: Thick dystrophic nails x 10. Thick broad callus under old ulcer site 5th MPJ left foot. Fissured cracking calluses on plantar posterior heel bilateral. Both feet and legs are extremely dry and scaly top and bottom.  Skin on both feet and legs are thin, shiny and pealing with random scabs. Hypertrophic nails x 10. Vascular: Pedal pulses are not palpable bilateral.  Orthopedic: All digits are hyperextended on lesser digits. Neurologic: Paresthesia with loss of normal tactile sensations on both feet.   Assessment: Callus under old ulcer site 5th MPJ left.  Fissured and cracking callus under both heels. Onychomycosis x 10. Diabetic neuropathy lower limbs.  PVD bilateral. Pain in lower limb.   Plan: Both feet soaked for 10 minutes.  Ulcerated calluses debrided.  All fissured calluses debrided on both heels. All nails debrided.  Vitamin A cream applied to both lower limbs and feet. Return in 6 weeks

## 2015-08-27 NOTE — Patient Instructions (Signed)
All lesions debrided after 10 minute soak. Return in 6 weeks.

## 2015-10-08 ENCOUNTER — Ambulatory Visit (INDEPENDENT_AMBULATORY_CARE_PROVIDER_SITE_OTHER): Payer: BC Managed Care – PPO | Admitting: Podiatry

## 2015-10-08 ENCOUNTER — Encounter: Payer: Self-pay | Admitting: Podiatry

## 2015-10-08 DIAGNOSIS — M21969 Unspecified acquired deformity of unspecified lower leg: Secondary | ICD-10-CM | POA: Diagnosis not present

## 2015-10-08 DIAGNOSIS — L989 Disorder of the skin and subcutaneous tissue, unspecified: Secondary | ICD-10-CM | POA: Diagnosis not present

## 2015-10-08 DIAGNOSIS — L97521 Non-pressure chronic ulcer of other part of left foot limited to breakdown of skin: Secondary | ICD-10-CM

## 2015-10-08 DIAGNOSIS — R234 Changes in skin texture: Secondary | ICD-10-CM

## 2015-10-08 DIAGNOSIS — M216X9 Other acquired deformities of unspecified foot: Secondary | ICD-10-CM | POA: Diagnosis not present

## 2015-10-08 NOTE — Patient Instructions (Signed)
Infected callus debrided and padded. Both feet casted for orthotics. Return in one week.

## 2015-10-08 NOTE — Progress Notes (Signed)
Subjective: 55 year old diabetic patient presents complaining of pain under the left foot.  Patient has past history of open ulcer on this site.   Objective:  Dermatologic:  Thick broad callus with draining pus under 5th MPJ left foot. No ascending cellulitis. No associated edema noted. Fissured cracking calluses on plantar posterior heel bilateral. Both feet and legs are extremely dry and scaly top and bottom.  Skin on both feet and legs are thin, shiny and pealing with random scabs. Hypertrophic nails x 10. Vascular: Pedal pulses are not palpable bilateral.  Orthopedic: All digits are hyperextended on lesser digits. 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  Plantar flexed 5th Metatarsal left with skin lesion. Neurologic: Paresthesia with loss of normal tactile sensations on both feet.   Assessment: Ulcerated callus, recurrent 5th MPJ plantar left foot. Fissured and cracking callus under both heels. Pes cavus with plantar flexed 5th metatarsal left.  Diabetic neuropathy lower limbs.  PVD bilateral. Pain in lower limb.   Plan: Both feet soaked for 10 minutes.  Ulcerated calluses debrided and applied aperture pad with Amerigel dressing.  Patient is to keep the dressing for 5 days and remove prior to the next visit. All fissured calluses debrided on both heels. Reviewed the benefit of custom orthotics to prevent recurring ulceration left foot. Both feet casted for Orthotics. Return in one week for follow up on infected callus left foot.

## 2015-10-14 ENCOUNTER — Encounter: Payer: Self-pay | Admitting: Podiatry

## 2015-10-14 ENCOUNTER — Ambulatory Visit (INDEPENDENT_AMBULATORY_CARE_PROVIDER_SITE_OTHER): Payer: BC Managed Care – PPO | Admitting: Podiatry

## 2015-10-14 DIAGNOSIS — M79673 Pain in unspecified foot: Secondary | ICD-10-CM | POA: Diagnosis not present

## 2015-10-14 DIAGNOSIS — E0842 Diabetes mellitus due to underlying condition with diabetic polyneuropathy: Secondary | ICD-10-CM

## 2015-10-14 DIAGNOSIS — L97521 Non-pressure chronic ulcer of other part of left foot limited to breakdown of skin: Secondary | ICD-10-CM

## 2015-10-14 NOTE — Progress Notes (Signed)
Subjective: 55 year old diabetic patient presents for follow up on recurrent ulcer left foot. He was in last week with open ulcerated callus at old ulcer site.  Objective:  Dermatologic: Healing ulcer with filled callus and open center, 0.3cm  Dry base without drainage. No associated cellulitis or edema noted.. Vascular: Pedal pulses are not palpable bilateral.  Orthopedic: All digits are hyperextended on lesser digits. 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  Plantar flexed 5th Metatarsal left with skin lesion. Neurologic: Paresthesia with loss of normal tactile sensations on both feet.   Assessment: Ulcerated callus, recurrent 5th MPJ plantar left foot, healing. Fissured and cracking callus under both heels. Pes cavus with plantar flexed 5th metatarsal left.  Diabetic neuropathy lower limbs.  PVD bilateral. Pain in lower limb.   Plan: Ulcerated calluses debrided and applied aperture pad with Amerigel dressing.  Patient is to keep the dressing for 5 days and remove prior to the next visit. Return in one week for follow up.

## 2015-10-14 NOTE — Patient Instructions (Signed)
Ulcer follow up care. Healing in progress. Return in one week.

## 2015-10-21 ENCOUNTER — Ambulatory Visit (INDEPENDENT_AMBULATORY_CARE_PROVIDER_SITE_OTHER): Payer: BC Managed Care – PPO | Admitting: Podiatry

## 2015-10-21 ENCOUNTER — Encounter: Payer: Self-pay | Admitting: Podiatry

## 2015-10-21 VITALS — BP 158/66 | HR 71

## 2015-10-21 DIAGNOSIS — L97521 Non-pressure chronic ulcer of other part of left foot limited to breakdown of skin: Secondary | ICD-10-CM

## 2015-10-21 DIAGNOSIS — M21969 Unspecified acquired deformity of unspecified lower leg: Secondary | ICD-10-CM

## 2015-10-21 DIAGNOSIS — E0842 Diabetes mellitus due to underlying condition with diabetic polyneuropathy: Secondary | ICD-10-CM | POA: Diagnosis not present

## 2015-10-21 NOTE — Patient Instructions (Signed)
Ulcer healed well under 5th MPJ left foot. Pad replaced. Keep it for a week. Return in 3 weeks.

## 2015-10-21 NOTE — Progress Notes (Signed)
Subjective: 55 year old diabetic patient presents for follow up on recurrent ulcer left foot. He has developed open ulcer 2 weeks ago at old ulcer site 5th MPJ plantar left.  Objective:  Dermatologic: Healing ulcer with filled callus and open center, 0.3cm Dry base without drainage. No associated cellulitis or edema noted.. Vascular: Pedal pulses are not palpable bilateral.  Orthopedic: All digits are hyperextended on lesser digits. 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  Plantar flexed 5th Metatarsal left with skin lesion. Neurologic: Paresthesia with loss of normal tactile sensations on both feet.   Assessment: Ulcerated callus, recurrent 5th MPJ plantar left foot, healing. Fissured and cracking callus under both heels. Pes cavus with plantar flexed 5th metatarsal left.  Diabetic neuropathy lower limbs.  PVD bilateral. Pain in lower limb.   Plan: Ulcerated calluses debrided and applied aperture pad with Amerigel dressing.  Patient is to keep the dressing for 5 days and remove prior to the next visit. Return in 3 week for follow up.

## 2015-11-11 ENCOUNTER — Ambulatory Visit (INDEPENDENT_AMBULATORY_CARE_PROVIDER_SITE_OTHER): Payer: BC Managed Care – PPO | Admitting: Podiatry

## 2015-11-11 ENCOUNTER — Encounter: Payer: Self-pay | Admitting: Podiatry

## 2015-11-11 VITALS — BP 136/60 | HR 61

## 2015-11-11 DIAGNOSIS — R234 Changes in skin texture: Secondary | ICD-10-CM

## 2015-11-11 DIAGNOSIS — B351 Tinea unguium: Secondary | ICD-10-CM | POA: Diagnosis not present

## 2015-11-11 DIAGNOSIS — L851 Acquired keratosis [keratoderma] palmaris et plantaris: Secondary | ICD-10-CM

## 2015-11-11 DIAGNOSIS — L989 Disorder of the skin and subcutaneous tissue, unspecified: Secondary | ICD-10-CM | POA: Diagnosis not present

## 2015-11-11 DIAGNOSIS — E0842 Diabetes mellitus due to underlying condition with diabetic polyneuropathy: Secondary | ICD-10-CM | POA: Diagnosis not present

## 2015-11-11 NOTE — Patient Instructions (Signed)
Seen for ulcerating callus and hypertrophic nails. All calluses and nails debrided. Return in one month.

## 2015-11-11 NOTE — Progress Notes (Signed)
Subjective: 55 year old diabetic patient presents for follow up on recurrent ulcer left foot.  Objective:  Dermatologic: Thick hypertrophic mycotic nails x 10. ealing ulcer with callus covering the base of lesion. No open skin noted. No associated cellulitis or edema noted.. Vascular: Pedal pulses are not palpable bilateral.  Orthopedic: All digits are hyperextended on lesser digits. 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  Plantar flexed 5th Metatarsal left with skin lesion. Neurologic: Paresthesia with loss of normal tactile sensations on both feet.   Assessment: Ulcerated callus healed and covered with thick callus.  Fissured and cracking callus under both heels. Thick dystrophic mycotic nails x 10. Pes cavus with plantar flexed 5th metatarsal left.  Diabetic neuropathy lower limbs.  PVD bilateral. Pain in lower limb.   Plan: Both feet soaked and all keratotic lesions and fissured calluses debrided. All nails debrided.  Return in one month.

## 2015-11-24 ENCOUNTER — Encounter: Payer: Self-pay | Admitting: Podiatry

## 2015-11-24 ENCOUNTER — Ambulatory Visit (INDEPENDENT_AMBULATORY_CARE_PROVIDER_SITE_OTHER): Payer: BC Managed Care – PPO | Admitting: Podiatry

## 2015-11-24 VITALS — BP 144/63 | HR 63

## 2015-11-24 DIAGNOSIS — S99921A Unspecified injury of right foot, initial encounter: Secondary | ICD-10-CM | POA: Diagnosis not present

## 2015-11-24 DIAGNOSIS — L97511 Non-pressure chronic ulcer of other part of right foot limited to breakdown of skin: Secondary | ICD-10-CM | POA: Diagnosis not present

## 2015-11-24 NOTE — Progress Notes (Signed)
Subjective: 55 year old diabetic patient presents stating that he fell 2 days ago and right great toe has developed a red spot with swelling.   Objective:  Dermatologic: Macerating right great toe at dorsomedial aspect of IPJ right superficial with inflamed surrounding tissue.  No active drainage noted. Base of the lesion is shallow and most in center. 1 cm outer diameter and 0.3 cm at inner opening.  Vascular: Pedal pulses are not palpable bilateral.  Orthopedic: All digits are hyperextended on lesser digits. 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  Neurologic: Paresthesia with loss of normal tactile sensations on both feet.   Assessment: Ulcerating toe right hallux following a fall.  Pes cavus with plantar flexed 5th metatarsal left.  Diabetic neuropathy lower limbs.  PVD bilateral. Pain in lower limb.   Plan: Affected area cleansed with H2O2 and Aperture pad applied followed by Amerigel ointment dressing. Placed in surgical shoe to take shoe pressure off of the hallux. Do daily dressing change and return in 10 days.

## 2015-11-24 NOTE — Patient Instructions (Signed)
Seen for injured right great toe. Open lesion placed in aperture pad. Patient is to wear surgical shoes till return. Return in 10 days.

## 2015-12-15 ENCOUNTER — Ambulatory Visit (INDEPENDENT_AMBULATORY_CARE_PROVIDER_SITE_OTHER): Payer: BC Managed Care – PPO | Admitting: Podiatry

## 2015-12-15 ENCOUNTER — Encounter: Payer: Self-pay | Admitting: Podiatry

## 2015-12-15 VITALS — BP 151/56 | HR 60

## 2015-12-15 DIAGNOSIS — L989 Disorder of the skin and subcutaneous tissue, unspecified: Secondary | ICD-10-CM

## 2015-12-15 DIAGNOSIS — R234 Changes in skin texture: Secondary | ICD-10-CM

## 2015-12-15 DIAGNOSIS — L97511 Non-pressure chronic ulcer of other part of right foot limited to breakdown of skin: Secondary | ICD-10-CM

## 2015-12-15 DIAGNOSIS — E0842 Diabetes mellitus due to underlying condition with diabetic polyneuropathy: Secondary | ICD-10-CM | POA: Diagnosis not present

## 2015-12-15 NOTE — Progress Notes (Signed)
Subjective: 55 year old diabetic patient presents for follow up on right great toe that was injured 2 weeks ago and follow up on ongoing fissured heels bilateral.   Objective:  Dermatologic: Right great toe at dorsomedial aspect of IPJ right is covered with round dry scab. No active drainage noted. Vascular:  Severe dry xerotic skin on both lower limbs.  Pedal pulses are not palpable bilateral.  Orthopedic: All digits are hyperextended on lesser digits. 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  Neurologic: Paresthesia with loss of normal tactile sensations on both feet.   Assessment: Healing ulcer toe right hallux following a fall.  Pes cavus with plantar flexed 5th metatarsal left.  Mild fissurating heel calluses. Diabetic neuropathy lower limbs.  PVD bilateral. Pain in lower limb.   Plan: Right great toe lesion debrided and applied with aperture pad and Amerigel ointment dressing. All lesions debrided. Return in 4 weeks or as needed.

## 2015-12-15 NOTE — Patient Instructions (Signed)
Follow up on bilateral cracking skin and abrasion on right great toe. All lesions are doing well. All debrided. Right great toe padded. Keep the pad for 1 week. Return in 4 weeks or sooner if needed.

## 2016-01-14 ENCOUNTER — Ambulatory Visit (INDEPENDENT_AMBULATORY_CARE_PROVIDER_SITE_OTHER): Payer: BC Managed Care – PPO | Admitting: Podiatry

## 2016-01-14 ENCOUNTER — Encounter: Payer: Self-pay | Admitting: Podiatry

## 2016-01-14 VITALS — BP 134/58 | HR 54

## 2016-01-14 DIAGNOSIS — L851 Acquired keratosis [keratoderma] palmaris et plantaris: Secondary | ICD-10-CM | POA: Diagnosis not present

## 2016-01-14 DIAGNOSIS — R234 Changes in skin texture: Secondary | ICD-10-CM | POA: Diagnosis not present

## 2016-01-14 DIAGNOSIS — M21969 Unspecified acquired deformity of unspecified lower leg: Secondary | ICD-10-CM

## 2016-01-14 DIAGNOSIS — E0842 Diabetes mellitus due to underlying condition with diabetic polyneuropathy: Secondary | ICD-10-CM

## 2016-01-14 NOTE — Progress Notes (Signed)
Subjective: 55 year old diabetic patient presents for follow up on right great toe lesion that was formed following a fall incident about 6 weeks ago and follow up on ongoing problem with fissured heels bilateral.   Objective:  Dermatologic: Right great toe at dorsomedial aspect of IPJ right is covered with dry thick circular keratotic lesion. Both lower limbs are dry with poor skin texture. Fissuring callus plantar posterior bilateral. Circular broad callus over previously ulcerated area under 5th MPJ left foot. Vascular:  Pedal pulses are not palpable bilateral.  Orthopedic: All digits are hyperextended on lesser digits. 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 Inverted forefoot left with sub 5 lesion.  Neurologic: Paresthesia with loss of normal tactile sensations on both feet.   Assessment: Healed ulcer toe right halluxl.  Dry  fissurating heel calluses without open skin. Plantar keratoma under 5th MPJ left. Pes cavus with plantar flexed 5th metatarsal left.  Diabetic neuropathy lower limbs.  PVD bilateral. Pain in lower limb.   Plan: Both feet were soaked for 15 minutes. All lesions debrided; Right great toe lesion, Both fissured callused heels, Plantar keratotic lesions bilateral. Both feet casted for orthotics, repeat endeavor after finding out missing negative cast impression that was made last month. Both lower limbs lubricated with Vitamin A cream.  Return in 4 weeks.

## 2016-01-14 NOTE — Patient Instructions (Signed)
All lesions debrided. Noted no active opening. Repeat casting for orthotics done. Return in 4 weeks.

## 2016-02-15 ENCOUNTER — Ambulatory Visit: Payer: BC Managed Care – PPO | Admitting: Podiatry

## 2016-02-22 ENCOUNTER — Ambulatory Visit: Payer: BC Managed Care – PPO | Admitting: Podiatry

## 2016-02-22 ENCOUNTER — Ambulatory Visit (INDEPENDENT_AMBULATORY_CARE_PROVIDER_SITE_OTHER): Payer: BC Managed Care – PPO | Admitting: Podiatry

## 2016-02-22 ENCOUNTER — Encounter: Payer: Self-pay | Admitting: Podiatry

## 2016-02-22 VITALS — BP 136/59 | HR 58

## 2016-02-22 DIAGNOSIS — E0842 Diabetes mellitus due to underlying condition with diabetic polyneuropathy: Secondary | ICD-10-CM

## 2016-02-22 DIAGNOSIS — B351 Tinea unguium: Secondary | ICD-10-CM | POA: Diagnosis not present

## 2016-02-22 DIAGNOSIS — R234 Changes in skin texture: Secondary | ICD-10-CM | POA: Diagnosis not present

## 2016-02-22 DIAGNOSIS — M79606 Pain in leg, unspecified: Secondary | ICD-10-CM | POA: Diagnosis not present

## 2016-02-22 NOTE — Progress Notes (Signed)
Subjective: 55 year old diabetic patient presents with painful feet. Have a vacation plan in two weeks.   Objective:  Dermatologic: Thick dystrophic nails x 10. Right great toe at dorsomedial aspect of IPJ right is covered with dry thick circular keratotic lesion. Both lower limbs are dry with poor skin texture. Fissuring callus plantar posterior bilateral. Circular broad callus over previously ulcerated area under 5th MPJ left foot. Vascular:  Pedal pulses are not palpable bilateral.  Orthopedic: All digits are hyperextended on lesser digits. 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 Inverted forefoot left with sub 5 lesion.  Neurologic: Paresthesia with loss of normal tactile sensations on both feet.   Assessment: Healed ulcer toe right hallux Dry  fissurating heel calluses without open skin. Plantar keratoma under 5th MPJ left. Pes cavus with plantar flexed 5th metatarsal left.  Diabetic neuropathy lower limbs.  PVD bilateral. Pain in lower limb.   Plan: Both feet were soaked for 15 minutes. All nails debrided. All lesions debrided; Right great toe lesion, Both fissured callused heels, Plantar keratotic lesions bilateral. Custom orthotics dispensed with instructions. Return before go out of town to have padding placed on left foot. Return in 2 weeks.

## 2016-02-22 NOTE — Patient Instructions (Signed)
All nails and pre ulcerative lesions debrided. Return for padding placement before go out of town in 2 weeks.

## 2016-03-03 ENCOUNTER — Ambulatory Visit (INDEPENDENT_AMBULATORY_CARE_PROVIDER_SITE_OTHER): Payer: BC Managed Care – PPO | Admitting: Podiatry

## 2016-03-03 ENCOUNTER — Encounter: Payer: Self-pay | Admitting: Podiatry

## 2016-03-03 DIAGNOSIS — R234 Changes in skin texture: Secondary | ICD-10-CM

## 2016-03-03 DIAGNOSIS — E0842 Diabetes mellitus due to underlying condition with diabetic polyneuropathy: Secondary | ICD-10-CM

## 2016-03-03 DIAGNOSIS — L97521 Non-pressure chronic ulcer of other part of left foot limited to breakdown of skin: Secondary | ICD-10-CM

## 2016-03-03 NOTE — Progress Notes (Signed)
Subjective: 55 year old diabetic patient presents for follow up on plantar foot ulcer.   Objective:  Dermatologic: Circular broad bleeding callus with skin breakdown at base over previously ulcerated area under 5th MPJ left foot. Vascular:  Fissuring callus plantar posterior right.  Extremely dry and scaly skin lower limbs bilateral.  Pedal pulses are not palpable bilateral.  Orthopedic: All digits are hyperextended on lesser digits. 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 Inverted forefoot left with sub 5 lesion.  Neurologic: Paresthesia with loss of normal tactile sensations on both feet.   Assessment: Deep fissurating heel calluses right foot.  Ulcerating plantar keratoma under 5th MPJ left. Pes cavus with plantar flexed 5th metatarsal left.  Diabetic neuropathy lower limbs.  PVD bilateral. Pain in lower limb.   Plan: All lesions debrided. Left foot 5th MPJ area placed in 1/4" felt pad with Amerigel dressing. Right heel dressed with Amerigel ointment. Return in 2 weeks.

## 2016-03-03 NOTE — Patient Instructions (Signed)
Breaking callus under right heel and under 5th MPJ left. Debrided and padded. Return in 2 weeks.

## 2016-03-15 ENCOUNTER — Encounter: Payer: Self-pay | Admitting: Podiatry

## 2016-03-15 ENCOUNTER — Ambulatory Visit (INDEPENDENT_AMBULATORY_CARE_PROVIDER_SITE_OTHER): Payer: BC Managed Care – PPO | Admitting: Podiatry

## 2016-03-15 DIAGNOSIS — M79606 Pain in leg, unspecified: Secondary | ICD-10-CM

## 2016-03-15 DIAGNOSIS — R234 Changes in skin texture: Secondary | ICD-10-CM

## 2016-03-15 DIAGNOSIS — L97521 Non-pressure chronic ulcer of other part of left foot limited to breakdown of skin: Secondary | ICD-10-CM | POA: Diagnosis not present

## 2016-03-15 NOTE — Patient Instructions (Signed)
Seen for ulcerating calluses. All lesions debrided and aperture pad placed. Return in 2 weeks.

## 2016-03-15 NOTE — Progress Notes (Signed)
Subjective: [age]year old diabetic patient presents for follow up on left foot ulcer and a new cracked heel on right foot that been painful while away on vacation.   Objective:  Dermatologic: New fissured heel callus with pain plantar posterior, 2 cm long deep down to subdermal layer.  No associated erythema or drainage noted. Circular broad bleeding callus with skin breakdown at base over previously ulcerated area under 5th MPJ left foot, symptomatic with weight bearing. Extremely dry and scaly skin lower limbs bilateral.  Vascular:  Pedal pulses are not palpable bilateral.  Orthopedic: All digits are hyperextended on lesser digits. 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 Inverted forefoot left with sub 5 lesion.  Neurologic: Paresthesia with loss of normal tactile sensations on both feet.   Assessment: Deep fissure right plantar posterior heel callus down to subdermal layer.  Ulcerating plantar keratoma under 5th MPJ left. Pes cavus with plantar flexed 5th metatarsal left.  Diabetic neuropathy lower limbs.  PVD bilateral. Pain in lower limb.   Plan: Both lesions debrided after 15 minute soak. Left foot 5th MPJ area and right plantar posterior heel placed in 1/4" Felt aperture pad with Amerigel dressing. Keep the dressing. Return in 2 weeks.

## 2016-03-29 ENCOUNTER — Ambulatory Visit (INDEPENDENT_AMBULATORY_CARE_PROVIDER_SITE_OTHER): Payer: BC Managed Care – PPO | Admitting: Podiatry

## 2016-03-29 ENCOUNTER — Encounter: Payer: Self-pay | Admitting: Podiatry

## 2016-03-29 DIAGNOSIS — M79606 Pain in leg, unspecified: Secondary | ICD-10-CM

## 2016-03-29 DIAGNOSIS — L97521 Non-pressure chronic ulcer of other part of left foot limited to breakdown of skin: Secondary | ICD-10-CM

## 2016-03-29 DIAGNOSIS — M21969 Unspecified acquired deformity of unspecified lower leg: Secondary | ICD-10-CM | POA: Diagnosis not present

## 2016-03-29 NOTE — Patient Instructions (Signed)
Follow up on ulcer. Right heel lesion healed well. Recurring left foot ulcer under 5th MPJ. Debrided and padded. Do daily dressing change with Amerigel ointment. Moisturize dry skin well.  Return in one week.

## 2016-03-29 NOTE — Progress Notes (Signed)
Subjective: [age]year old diabetic patient presents for follow up on left foot ulcer and a new cracked heel on right foot that been painful while away on vacation.  Left foot has been painful to bear weight.   Objective:  Dermatologic: Fissured heel calluses are healed. Recurrent ulcer under 5th MPJ left with drainage, symptomatic with weight bearing. Extremely dry and scaly skin lower limbs bilateral.  Vascular:  Pedal pulses are not palpable bilateral.  Orthopedic: All digits are hyperextended on lesser digits. 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 Inverted forefoot left with sub 5 lesion.  Neurologic: Paresthesia with loss of normal tactile sensations on both feet.   Assessment: Recurring ulcer under 5th MPJ left with 0.5cm opening.  Pes cavus with plantar flexed 5th metatarsal left.  Diabetic neuropathy lower limbs.  PVD bilateral. Pain in lower limb.   Plan: Both heels and left foot lesion debrided after 15 minute soak. Left foot 5th MPJ area padded with 1/4" Felt aperture pad with Amerigel dressing. Keep the pad and change outer dressing daily.  Return in 1 week.

## 2016-04-05 ENCOUNTER — Ambulatory Visit (INDEPENDENT_AMBULATORY_CARE_PROVIDER_SITE_OTHER): Payer: BC Managed Care – PPO | Admitting: Podiatry

## 2016-04-05 ENCOUNTER — Encounter: Payer: Self-pay | Admitting: Podiatry

## 2016-04-05 DIAGNOSIS — E0842 Diabetes mellitus due to underlying condition with diabetic polyneuropathy: Secondary | ICD-10-CM | POA: Diagnosis not present

## 2016-04-05 DIAGNOSIS — L97521 Non-pressure chronic ulcer of other part of left foot limited to breakdown of skin: Secondary | ICD-10-CM

## 2016-04-05 DIAGNOSIS — M21969 Unspecified acquired deformity of unspecified lower leg: Secondary | ICD-10-CM | POA: Diagnosis not present

## 2016-04-05 DIAGNOSIS — M79606 Pain in leg, unspecified: Secondary | ICD-10-CM

## 2016-04-05 NOTE — Progress Notes (Signed)
Subjective: [age]year old diabetic patient presents for follow up on left foot ulcer and a new cracked heel on right foot . Left foot continues to be painful to bear weight.   Objective:  Dermatologic: Recurring fissured heel calluses on right heel. Recurrent ulcer under 5th MPJ left deep down to subdermal layer, symptomatic with weight bearing. Extremely dry and scaly skin lower limbs bilateral.  Vascular: Pedal pulses are not palpable bilateral.  Orthopedic: All digits are hyperextended on lesser digits. 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 Inverted forefoot left with sub 5 lesion.  Neurologic: Paresthesia with loss of normal tactile sensations on both feet.   Assessment: Recurring ulcer deep to subdermal layer under 5th MPJ left with 0.5cm opening.  Pes cavus with plantar flexed 5th metatarsal left.  Fissured dry callus right heel. Diabetic neuropathy lower limbs.  PVD bilateral. Pain in lower limb.   Plan: Both heels and left foot lesion debrided and padded on right heel. Left foot 5th MPJ area debrided and padded with 1/4" Felt aperture pad with Amerigel dressing. Keep the pad and change outer dressing daily.  Return in 1 week.

## 2016-04-05 NOTE — Patient Instructions (Signed)
Seen for ulcer left foot and crack on right heel. Debrided and padded both feet. Keep the pad dry and intact. Do daily dressing change with Amerigel.

## 2016-04-11 ENCOUNTER — Telehealth: Payer: Self-pay | Admitting: *Deleted

## 2016-04-11 NOTE — Telephone Encounter (Signed)
04/11/16 Dr. Raynald KempSheard,  Ortencia KickByron had an appointment for tomorrow 04/11/16 but had to reschedule because he threw his back out, He ask does he leave the bandage on until then or does he need to take it off.

## 2016-04-12 ENCOUNTER — Ambulatory Visit: Payer: BC Managed Care – PPO | Admitting: Podiatry

## 2016-04-14 ENCOUNTER — Ambulatory Visit: Payer: BC Managed Care – PPO | Admitting: Podiatry

## 2016-04-18 ENCOUNTER — Encounter: Payer: Self-pay | Admitting: Podiatry

## 2016-04-18 ENCOUNTER — Ambulatory Visit (INDEPENDENT_AMBULATORY_CARE_PROVIDER_SITE_OTHER): Payer: BC Managed Care – PPO | Admitting: Podiatry

## 2016-04-18 DIAGNOSIS — L97522 Non-pressure chronic ulcer of other part of left foot with fat layer exposed: Secondary | ICD-10-CM

## 2016-04-18 DIAGNOSIS — M21969 Unspecified acquired deformity of unspecified lower leg: Secondary | ICD-10-CM

## 2016-04-18 DIAGNOSIS — R234 Changes in skin texture: Secondary | ICD-10-CM

## 2016-04-18 DIAGNOSIS — M79606 Pain in leg, unspecified: Secondary | ICD-10-CM | POA: Diagnosis not present

## 2016-04-18 DIAGNOSIS — E0842 Diabetes mellitus due to underlying condition with diabetic polyneuropathy: Secondary | ICD-10-CM | POA: Diagnosis not present

## 2016-04-18 DIAGNOSIS — L84 Corns and callosities: Secondary | ICD-10-CM | POA: Diagnosis not present

## 2016-04-18 NOTE — Patient Instructions (Signed)
Follow up on left foot ulcer. Debrided and padded. Keep the pad on for the next 6 days. Return in one week.

## 2016-04-18 NOTE — Progress Notes (Signed)
Subjective: [age]year old diabetic patient presents for follow up on left foot ulcer and fissured heel on right foot . Left foot hurts to walk. He had recent back injury and doing better now.   Objective:  Dermatologic: Recurring fissured heel calluses on right heel. Recurrent ulcer under 5th MPJ left. Wound size enlarged and got deeper subdermal layer, symptomatic with weight bearing. Extremely dry and scaly skin lower limbs bilateral.  Vascular: Pedal pulses are not palpable bilateral.  Orthopedic: All digits are hyperextended on lesser digits. 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 Inverted forefoot left with sub 5 lesion.  Neurologic: Paresthesia with loss of normal tactile sensations on both feet.   Assessment: Recurring ulcer deep to fatty llayer under 5th MPJ left with 0.5 x 1.5 cm opening.  Pes cavus with plantar flexed 5th metatarsal left.  Fissured dry callus right heel. Diabetic neuropathy lower limbs.  PVD bilateral. Pain in lower limb.   Plan: Both heels and left foot lesion debrided and padded on right heel. Left foot 5th MPJ area debrided and padded with1/4" Felt aperture pad with Amerigel dressing. Keep the pad and change outer dressing daily.  Return in 1week.

## 2016-04-25 ENCOUNTER — Encounter: Payer: Self-pay | Admitting: Podiatry

## 2016-04-25 ENCOUNTER — Ambulatory Visit (INDEPENDENT_AMBULATORY_CARE_PROVIDER_SITE_OTHER): Payer: BC Managed Care – PPO | Admitting: Podiatry

## 2016-04-25 DIAGNOSIS — L97522 Non-pressure chronic ulcer of other part of left foot with fat layer exposed: Secondary | ICD-10-CM

## 2016-04-25 DIAGNOSIS — B351 Tinea unguium: Secondary | ICD-10-CM

## 2016-04-25 DIAGNOSIS — E0842 Diabetes mellitus due to underlying condition with diabetic polyneuropathy: Secondary | ICD-10-CM

## 2016-04-25 DIAGNOSIS — R234 Changes in skin texture: Secondary | ICD-10-CM

## 2016-04-25 NOTE — Progress Notes (Signed)
Subjective: [age]year old diabetic patient presents for follow up on left foot ulcer and fissured heel on right foot . He had recent back injury and doing better now.   Objective:  Dermatologic: Recurrent ulcer under 5th MPJ left.  Last week the wound size enlarged and got deeper to subdermal layer. Wound is closing in with same deep fissure like base. No active drainage and not associated surround erythema noted. Extremely dry and scaly skin lower limbs bilateral.  Vascular: Pedal pulses are not palpable bilateral.  Orthopedic: All digits are hyperextended on lesser digits. 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 Inverted forefoot left with sub 5 lesion.  Neurologic: Paresthesia with loss of normal tactile sensations on both feet.   Assessment: Recurring ulcer deep to fatty llayerunder 5th MPJ left with 0.5 x 1.5 cm opening.  Pes cavus with plantar flexed 5th metatarsal left.  Fissured dry callus right heel. Diabetic neuropathy lower limbs.  PVD bilateral. Pain in lower limb.  Thick hypertrophic nails x 10.  Plan: Both heels and left foot lesion debrided and padded on right heel. All nails debrided. Left foot 5th MPJ area debrided and padded with1/4" Felt aperture pad with Amerigel dressing. Keep the pad and change outer dressing daily.  Return in 1week.

## 2016-04-25 NOTE — Patient Instructions (Signed)
1 week follow up on left foot ulcer.  Making slow progress. Keep the pad one week. Remove the day before coming next week.

## 2016-04-29 ENCOUNTER — Other Ambulatory Visit: Payer: Self-pay | Admitting: Anesthesiology

## 2016-04-29 DIAGNOSIS — M542 Cervicalgia: Secondary | ICD-10-CM

## 2016-05-02 ENCOUNTER — Ambulatory Visit (INDEPENDENT_AMBULATORY_CARE_PROVIDER_SITE_OTHER): Payer: BC Managed Care – PPO | Admitting: Podiatry

## 2016-05-02 ENCOUNTER — Encounter: Payer: Self-pay | Admitting: Podiatry

## 2016-05-02 VITALS — BP 165/64 | HR 65

## 2016-05-02 DIAGNOSIS — M21969 Unspecified acquired deformity of unspecified lower leg: Secondary | ICD-10-CM

## 2016-05-02 DIAGNOSIS — L97522 Non-pressure chronic ulcer of other part of left foot with fat layer exposed: Secondary | ICD-10-CM

## 2016-05-02 DIAGNOSIS — E0842 Diabetes mellitus due to underlying condition with diabetic polyneuropathy: Secondary | ICD-10-CM | POA: Diagnosis not present

## 2016-05-02 NOTE — Patient Instructions (Addendum)
Follow up on left foot ulcer. Ulcer is improving. Soaked and debrided and padded. Keep dressing intact till the night before the next appointment. Return in one week.

## 2016-05-02 NOTE — Progress Notes (Signed)
Subjective: [age]year old diabetic patient presents for follow up on left foot ulcer and fissured heel on right foot . Positive of recent back injury and doing fine.   Objective:  Dermatologic: Recurrent ulcer under 5th MPJ left.  Wound is clean and narrowing down.  Extremely dry and scaly skin lower limbs bilateral.  New lesion left posterior heel with damaged skin from external trauma.  Vascular: Pedal pulses are not palpable bilateral.  Orthopedic: All digits are hyperextended on lesser digits. 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 Inverted forefoot left with sub 5 lesion.  Neurologic: Paresthesia with loss of normal tactile sensations on both feet.   Assessment: New skin damage left posterior heel with old blood on surface. Recurring ulcer deep to fatty llayerunder 5th MPJ left with 0.5 x 1.5 cm with decreased depth. Pes cavus with plantar flexed 5th metatarsal left.  Fissured dry callus right heel. Diabetic neuropathy lower limbs.  PVD bilateral. Pain in lower limb.   Plan: Both feet soaked and scrubbed dry scaly skin. Both heels and left foot lesions debrided Left heel padded with aperture pad and Amerigel ointment dressing.  Left foot 5th MPJ area debrided and padded with1/4" Felt aperture pad with Amerigel dressing. Keep the dressing intact until the day before the next appointment. Remove all dressings and cleanse well before returning for the next week.  Return in 1week.

## 2016-05-07 ENCOUNTER — Inpatient Hospital Stay: Admission: RE | Admit: 2016-05-07 | Payer: BC Managed Care – PPO | Source: Ambulatory Visit

## 2016-05-09 ENCOUNTER — Encounter: Payer: Self-pay | Admitting: Podiatry

## 2016-05-09 ENCOUNTER — Ambulatory Visit (INDEPENDENT_AMBULATORY_CARE_PROVIDER_SITE_OTHER): Payer: BC Managed Care – PPO | Admitting: Podiatry

## 2016-05-09 DIAGNOSIS — L97522 Non-pressure chronic ulcer of other part of left foot with fat layer exposed: Secondary | ICD-10-CM | POA: Diagnosis not present

## 2016-05-09 DIAGNOSIS — M21969 Unspecified acquired deformity of unspecified lower leg: Secondary | ICD-10-CM

## 2016-05-09 DIAGNOSIS — R234 Changes in skin texture: Secondary | ICD-10-CM

## 2016-05-09 NOTE — Progress Notes (Signed)
Subjective: [age]year old diabetic patient presents for follow up on left foot ulcer and fissured heel on right foot . Positive of recent back injury and doing fine.   Objective:  Dermatologic: Recurrent ulcer under 5th MPJ left.  Wound is clean and narrowing down to 1 cm long and 0.2 cm wide. No drainage.  Extremely dry and scaly skin lower limbs bilateral.  New lesion left posterior heel is closing off with scab formation.  Vascular: Pedal pulses are not palpable bilateral.  Orthopedic: All digits are hyperextended on lesser digits. 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 Inverted forefoot left with sub 5 lesion.  Neurologic: Paresthesia with loss of normal tactile sensations on both feet.   Assessment: New skin damage left posterior heel is healing with scab formation. Recurring ulcer deep to fatty llayerunder 5th MPJ left with 0.5 x 1.5 cm with decreased depth. Pes cavus with plantar flexed 5th metatarsal left.  Fissured dry callus right heel. Diabetic neuropathy lower limbs.  PVD bilateral. Pain in lower limb.   Plan: Both feet soaked and scrubbed dry scaly skin. Both heels and left foot lesions debrided Left foot 5th MPJ area debrided and padded with1/4" Felt aperture pad with Amerigel dressing. Keep the dressing intact until the day before the next appointment. Remove all dressings and cleanse well before returning for the next week.  Return in 1week.

## 2016-05-09 NOTE — Patient Instructions (Signed)
Improving left foot lesion. Continue with current level of care. Keep the pad for a week. Return in one week. Remove all pads a day before appointment.

## 2016-05-12 ENCOUNTER — Other Ambulatory Visit: Payer: Self-pay | Admitting: Anesthesiology

## 2016-05-16 ENCOUNTER — Ambulatory Visit (INDEPENDENT_AMBULATORY_CARE_PROVIDER_SITE_OTHER): Payer: BC Managed Care – PPO | Admitting: Podiatry

## 2016-05-16 ENCOUNTER — Encounter: Payer: Self-pay | Admitting: Podiatry

## 2016-05-16 ENCOUNTER — Ambulatory Visit: Payer: BC Managed Care – PPO | Admitting: Podiatry

## 2016-05-16 DIAGNOSIS — E0842 Diabetes mellitus due to underlying condition with diabetic polyneuropathy: Secondary | ICD-10-CM

## 2016-05-16 DIAGNOSIS — L97522 Non-pressure chronic ulcer of other part of left foot with fat layer exposed: Secondary | ICD-10-CM

## 2016-05-16 NOTE — Progress Notes (Signed)
Subjective: 56year old diabetic patient presents for follow up on left foot ulcer and fissured heel on right foot . Stated that he noted of some pain on left foot at ulcer area yesterday. He has soaked and redressed the wound by his wife.  Objective:  Dermatologic: Recurrent ulcer under 5th MPJ left.  Wound is clean and narrowing down to 1 cm long and 0.2 cm wide. No drainage.  Extremely dry and scaly skin lower limbs bilateral.  New lesion left posterior heel is closing off with scab formation.  Vascular: Pedal pulses are not palpable bilateral.  Orthopedic: All digits are hyperextended on lesser digits. 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 Inverted forefoot left with sub 5 lesion.  Neurologic: Paresthesia with loss of normal tactile sensations on both feet.   Assessment: Recurring ulcer deep to fatty llayerunder 5th MPJ left with 0.5 x 1.5 cm with no improvement since last visit. 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 Fissured dry callus right heel improving.  Diabetic neuropathy lower limbs.  PVD bilateral. Pain in lower limb.   Plan: Discussed the findings and importance of off loading of left foot.  Discussed possible benefit of getting Knee walker. His house is not able to accommodate wheelchair.  He will try to get the knee walker so that he can have complete off loading of left foot.  Both feet soaked and scrubbed dry scaly skin. Left foot 5th MPJ area debrided and padded with1/4" Felt aperture pad with Amerigel dressing. Keep the dressing intact until the day before the next appointment. Remove all dressings and cleanse well before returning for the next week.  Return in 1week.

## 2016-05-16 NOTE — Patient Instructions (Signed)
Follow up on left foot ulcer. No change in size. Need to take weight off of left foot. May benefit from knee walker to have load free on left foot. Return in one week.

## 2016-05-23 ENCOUNTER — Encounter: Payer: Self-pay | Admitting: Podiatry

## 2016-05-23 ENCOUNTER — Ambulatory Visit (INDEPENDENT_AMBULATORY_CARE_PROVIDER_SITE_OTHER): Payer: BC Managed Care – PPO | Admitting: Podiatry

## 2016-05-23 DIAGNOSIS — R234 Changes in skin texture: Secondary | ICD-10-CM | POA: Diagnosis not present

## 2016-05-23 DIAGNOSIS — L97522 Non-pressure chronic ulcer of other part of left foot with fat layer exposed: Secondary | ICD-10-CM

## 2016-05-23 DIAGNOSIS — E0842 Diabetes mellitus due to underlying condition with diabetic polyneuropathy: Secondary | ICD-10-CM

## 2016-05-23 NOTE — Patient Instructions (Signed)
1 week follow up on left foot ulcer. Seen slow improvement. Continue current level of care. Return in one week.

## 2016-05-23 NOTE — Progress Notes (Signed)
Subjective: [age]year old diabetic patient presents for follow up on left foot ulcer and fissured heel on right foot . Stated that he is using Knee walker and able to take load off of left foot.  Objective:  Dermatologic: Recurrent ulcer under 5th MPJ left.  Wound is clean and narrowing down to 1 cm long and 0.2 cm wide. Minimum change in wound size.  Extremely dry and scaly skin lower limbs bilateral.  New lesion left posterior heel is closing off with scab formation.  Vascular: Pedal pulses are not palpable bilateral.  Orthopedic: All digits are hyperextended on lesser digits. 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 Inverted forefoot left with sub 5 lesion.  Neurologic: Paresthesia with loss of normal tactile sensations on both feet.   Assessment: Recurring ulcer deep to fatty llayerunder 5th MPJ left with 0.5 x 1.5 cm with no improvement since last visit. 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 Fissured dry callus right heel improving.  Diabetic neuropathy lower limbs.  PVD bilateral. Pain in lower limb.   Plan: Continue with off loading.  Both feet soaked and scrubbed dry scaly skin. Left foot 5th MPJ area debrided and padded with1/4" Felt aperture pad with Amerigel dressing. Keep the dressing intact until the day before the next appointment. Remove all dressings and cleanse well before returning for the next week.  Return in 1week.

## 2016-05-25 ENCOUNTER — Other Ambulatory Visit: Payer: Self-pay | Admitting: Anesthesiology

## 2016-05-25 DIAGNOSIS — M545 Low back pain: Secondary | ICD-10-CM

## 2016-05-30 ENCOUNTER — Ambulatory Visit (INDEPENDENT_AMBULATORY_CARE_PROVIDER_SITE_OTHER): Payer: BC Managed Care – PPO | Admitting: Podiatry

## 2016-05-30 DIAGNOSIS — M21969 Unspecified acquired deformity of unspecified lower leg: Secondary | ICD-10-CM | POA: Diagnosis not present

## 2016-05-30 DIAGNOSIS — L97522 Non-pressure chronic ulcer of other part of left foot with fat layer exposed: Secondary | ICD-10-CM

## 2016-05-30 DIAGNOSIS — E0842 Diabetes mellitus due to underlying condition with diabetic polyneuropathy: Secondary | ICD-10-CM

## 2016-05-30 NOTE — Patient Instructions (Signed)
Follow up on left foot lesion.  Making slow progress. Keep load off of the foot completely. Return in 1 week.

## 2016-05-30 NOTE — Progress Notes (Signed)
Subjective: [age]year old diabetic patient presents for follow up on left foot ulcer and fissured heel on right foot . Patient still using Knee walker mostly at home.   Objective:  Dermatologic: Recurrent ulcer under 5th MPJ left. No change since last visit. Wound is clean and narrowing down to 1 cm long and 0.2 cm wide. Minimum change in wound size.  Extremely dry and scaly skin lower limbs bilateral.  New lesion left posterior heel is closing off with scab formation. Vascular: Pedal pulses are not palpable bilateral.  Orthopedic: All digits are hyperextended on lesser digits. 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 Inverted forefoot left with sub 5 lesion.  Neurologic: Paresthesia with loss of normal tactile sensations on both feet.   Assessment: Recurring ulcer deep to fatty llayerunder 5th MPJ left with 0.5 x 1.5 cm with no improvement since last visit. 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 Fissured dry callus right heel improving.  Diabetic neuropathy lower limbs.  PVD bilateral. Pain in lower limb.   Plan: Continue with off loading.  Both feet soaked and scrubbed dry scaly skin. Left foot 5th MPJ area debrided and padded with1/4" Felt aperture pad with Amerigel dressing. Keep the dressing intact until the day before the next appointment. Remove all dressings and cleanse well before returning for the next week.  Return in 1week.

## 2016-05-31 ENCOUNTER — Encounter: Payer: Self-pay | Admitting: Podiatry

## 2016-06-05 ENCOUNTER — Other Ambulatory Visit: Payer: BC Managed Care – PPO

## 2016-06-06 ENCOUNTER — Ambulatory Visit (INDEPENDENT_AMBULATORY_CARE_PROVIDER_SITE_OTHER): Payer: BC Managed Care – PPO | Admitting: Podiatry

## 2016-06-06 ENCOUNTER — Ambulatory Visit: Payer: BC Managed Care – PPO | Admitting: Podiatry

## 2016-06-06 DIAGNOSIS — E0842 Diabetes mellitus due to underlying condition with diabetic polyneuropathy: Secondary | ICD-10-CM | POA: Diagnosis not present

## 2016-06-06 DIAGNOSIS — M21969 Unspecified acquired deformity of unspecified lower leg: Secondary | ICD-10-CM

## 2016-06-06 DIAGNOSIS — L97522 Non-pressure chronic ulcer of other part of left foot with fat layer exposed: Secondary | ICD-10-CM | POA: Diagnosis not present

## 2016-06-06 NOTE — Patient Instructions (Signed)
Follow up on left foot ulcer. Noted of slow improvement. Continue current level of care. Take load off and keep the pad x 1 week.

## 2016-06-06 NOTE — Progress Notes (Signed)
Subjective: [age]year old diabetic patient presents for follow up on left foot ulcer and fissured heel on right foot . Patient still using Scooter, Knee walker mostly at home.  No new problems.   Objective:  Dermatologic: Recurrent ulcer under 5th MPJ left. No change since last visit. Wound is clean and narrowing down to 1 cm long and 0.2 cm wide. Minimum change in wound size.  Extremely dry and scaly skin lower limbs bilateral.  New lesion left posterior heel is closing off with scab formation. Vascular: Pedal pulses are not palpable bilateral.  Orthopedic: All digits are hyperextended on lesser digits. 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 Inverted forefoot left with sub 5 lesion.  Neurologic: Paresthesia with loss of normal tactile sensations on both feet.   Assessment: Improving ulcer deep to fatty llayerunder 5th MPJ left with 0.2 x 1 cm with no improvement since last visit. 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 Fissured dry callus right heel improving.  Diabetic neuropathy lower limbs.  PVD bilateral. Pain in lower limb.   Plan: Continue with off loading.  Both feet soaked and scrubbed dry scaly skin. Left foot 5th MPJ area debrided and padded with1/4" Felt aperture pad with Amerigel dressing. Keep the dressing intact until the day before the next appointment. Remove all dressings and cleanse well before returning for the next week.  Return in 1week.

## 2016-06-07 ENCOUNTER — Encounter: Payer: Self-pay | Admitting: Podiatry

## 2016-06-12 ENCOUNTER — Other Ambulatory Visit: Payer: BC Managed Care – PPO

## 2016-06-13 ENCOUNTER — Encounter: Payer: Self-pay | Admitting: Podiatry

## 2016-06-13 ENCOUNTER — Ambulatory Visit (INDEPENDENT_AMBULATORY_CARE_PROVIDER_SITE_OTHER): Payer: BC Managed Care – PPO | Admitting: Podiatry

## 2016-06-13 DIAGNOSIS — L97522 Non-pressure chronic ulcer of other part of left foot with fat layer exposed: Secondary | ICD-10-CM

## 2016-06-13 DIAGNOSIS — E0842 Diabetes mellitus due to underlying condition with diabetic polyneuropathy: Secondary | ICD-10-CM

## 2016-06-13 NOTE — Progress Notes (Signed)
Subjective: [age]year old diabetic patient presents for follow up on left foot ulcer under 5th MPJ area left foot. Patient still usingScooter, Knee walker mostly at home.  Been making a slow progress. Stated that he had to take his Mother in law to hospital and had to make long walk. His legs are sore from walking.  Objective:  Dermatologic: Recurrent ulcer under 5th MPJ left.  Wound is covered with newly formed blistered callus and increased base in size to 0.4 x 2 cm, with fat layer exposed.  Minimum inflammation associated with the open ulcer. No active drainage noted.  Extremely dry and scaly skin lower limbs bilateral.  Vascular: Pedal pulses are not palpable bilateral.  Orthopedic: All digits are hyperextended on lesser digits. 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  Inverted forefoot with load bearing under 5th MPJ left foot.  Neurologic: Paresthesia with loss of normal tactile sensations on both feet.   Assessment: Increased ulcer size following increased weight bearing last week. Wound size is 0.4 x 2 cm open base covered with blistered callus.  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 Diabetic neuropathy lower limbs.  PVD bilateral. Pain in lower limb.   Plan: Continue with off loading.  Both feet soaked and scrubbed dry scaly skin. Left foot 5th MPJ area debrided and padded with1/4" Felt aperture pad with Amerigel dressing. Left foot placed in Surgical shoe modified with added cut out OTC full length orthotic and 1/4" felt pad.  Discussed to stop running arrends that requiring weight bearing.  Keep the dressing intact until the day before the next appointment. Remove all dressings and cleanse well before returning for the next week.  Return in 1week.

## 2016-06-13 NOTE — Patient Instructions (Signed)
Seen for left foot ulcer. Noted of increased size and depth due to increased weight bearing. Discussed to stay off of feet. Use knee walker. Placed in surgical shoe modified with OTC orthotics and 1/4" felt pad. Return in one week.

## 2016-06-20 ENCOUNTER — Encounter: Payer: Self-pay | Admitting: Podiatry

## 2016-06-20 ENCOUNTER — Ambulatory Visit (INDEPENDENT_AMBULATORY_CARE_PROVIDER_SITE_OTHER): Payer: BC Managed Care – PPO | Admitting: Podiatry

## 2016-06-20 DIAGNOSIS — E0842 Diabetes mellitus due to underlying condition with diabetic polyneuropathy: Secondary | ICD-10-CM

## 2016-06-20 DIAGNOSIS — M79671 Pain in right foot: Secondary | ICD-10-CM | POA: Diagnosis not present

## 2016-06-20 DIAGNOSIS — M216X2 Other acquired deformities of left foot: Secondary | ICD-10-CM | POA: Diagnosis not present

## 2016-06-20 DIAGNOSIS — M79672 Pain in left foot: Secondary | ICD-10-CM | POA: Diagnosis not present

## 2016-06-20 DIAGNOSIS — M79604 Pain in right leg: Secondary | ICD-10-CM

## 2016-06-20 DIAGNOSIS — M79605 Pain in left leg: Secondary | ICD-10-CM

## 2016-06-20 DIAGNOSIS — S99912A Unspecified injury of left ankle, initial encounter: Secondary | ICD-10-CM

## 2016-06-20 DIAGNOSIS — L97522 Non-pressure chronic ulcer of other part of left foot with fat layer exposed: Secondary | ICD-10-CM

## 2016-06-20 DIAGNOSIS — B351 Tinea unguium: Secondary | ICD-10-CM

## 2016-06-20 NOTE — Progress Notes (Signed)
Subjective: [age]year old diabetic patient presents for follow up on left foot ulcer under 5th MPJ area left foot. Stated that flat surgical shoe was make his ankle to turn an lose his balance. He only stayed on surgical shoe for 2 days. Patient still usingScooter, Knee walker mostly at home.   Objective:  Dermatologic: Recurrent ulcer under 5th MPJ left.  Wound is covered with newly formed blistered callus and increased base in size to 0.4 x 2 cm, with fat layer exposed.  Minimum inflammation associated with the open ulcer. No active drainage noted.  Extremely dry and scaly skin lower limbs bilateral.  Vascular: Pedal pulses are not palpable bilateral.  Orthopedic: All digits are hyperextended on lesser digits. 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  Inverted forefoot with load bearing under 5th MPJ left foot.  Neurologic: Paresthesia with loss of normal tactile sensations on both feet.   Assessment: No change in wound size, 0.4 x 2 cm open base covered with blistered callus.  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 Diabetic neuropathy lower limbs.  PVD bilateral. Pain in lower limb.  Thick dystrophic nails x 10.  Plan: Continue with off loading.  Both feet soaked and scrubbed dry scaly skin. Left foot 5th MPJ area debrided and padded with1/4" Felt aperture pad with Amerigel dressing. Left foot placed in ankle brace to prevent ankle sprain. Continue with Surgical shoe that was modified with added cut out OTC full length orthotic and 1/4" felt pad.  Discussed possible use of Ritch brace to limit ankle varus rotation that is aggravating the ulcer under 5th MPJ left foot. Remove all dressings and cleanse well before returning for the next week.  Return in 1week.

## 2016-06-20 NOTE — Patient Instructions (Signed)
Follow up on left foot ulcer. 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 Ankle brace placed on left. May benefit from Ritchie brace.

## 2016-06-27 ENCOUNTER — Ambulatory Visit (INDEPENDENT_AMBULATORY_CARE_PROVIDER_SITE_OTHER): Payer: BC Managed Care – PPO | Admitting: Podiatry

## 2016-06-27 ENCOUNTER — Encounter: Payer: Self-pay | Admitting: Podiatry

## 2016-06-27 DIAGNOSIS — E0842 Diabetes mellitus due to underlying condition with diabetic polyneuropathy: Secondary | ICD-10-CM | POA: Diagnosis not present

## 2016-06-27 DIAGNOSIS — M79605 Pain in left leg: Secondary | ICD-10-CM | POA: Diagnosis not present

## 2016-06-27 DIAGNOSIS — L97522 Non-pressure chronic ulcer of other part of left foot with fat layer exposed: Secondary | ICD-10-CM | POA: Diagnosis not present

## 2016-06-27 NOTE — Progress Notes (Signed)
Subjective: [age]year old diabetic patient presents for follow up on left foot ulcer under 5th MPJ area left foot. Stated that he was not able to use surgical shoe with insole placed. The heel part was coming off.  The Ankle brace was helping his foot to stand straighter.   Objective:  Dermatologic: Recurrent ulcer under 5th MPJ left.  Wound size decreased in length, 0.4 x 1 cm, with fat layer exposed.  Minimum inflammation associated with the open ulcer. No active drainage noted.  Extremely dry and scaly skin lower limbs bilateral.  Vascular: Pedal pulses are not palpable bilateral.  Orthopedic: All digits are hyperextended on lesser digits. 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  Inverted forefoot with load bearing under 5th MPJ left foot.  Neurologic: Paresthesia with loss of normal tactile sensations on both feet.   Assessment: Improving wound with decreased wound size, 0.4 x 1 cm open base covered with blistered callus.  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 Diabetic neuropathy lower limbs.  PVD bilateral. Pain in lower limb.   Plan: Continue with off loading.  Both feet soaked and scrubbed dry scaly skin. Left foot 5th MPJ area debrided and padded with1/4" Felt aperture pad with Amerigel dressing. Continue with ankle brace to prevent ankle sprain. Aperture pad placed in flat Surgical shoe without insole.  Remove all dressings and cleanse well before returning for the next week.  Return in 1week.

## 2016-06-27 NOTE — Patient Instructions (Signed)
Seen for follow up on left foot ulcer. Making small improvement. Return in one week.

## 2016-07-04 ENCOUNTER — Ambulatory Visit (INDEPENDENT_AMBULATORY_CARE_PROVIDER_SITE_OTHER): Payer: BC Managed Care – PPO | Admitting: Podiatry

## 2016-07-04 DIAGNOSIS — E0842 Diabetes mellitus due to underlying condition with diabetic polyneuropathy: Secondary | ICD-10-CM | POA: Diagnosis not present

## 2016-07-04 DIAGNOSIS — L97522 Non-pressure chronic ulcer of other part of left foot with fat layer exposed: Secondary | ICD-10-CM

## 2016-07-04 DIAGNOSIS — M21969 Unspecified acquired deformity of unspecified lower leg: Secondary | ICD-10-CM

## 2016-07-04 NOTE — Progress Notes (Signed)
Subjective: 56year old diabetic patient presents for follow up on left foot ulcer under 5th MPJ area left foot. The Ankle brace was helping his foot to stand straighter.   Objective:  Dermatologic: Recurrent ulcer under 5th MPJ left.  Wound size decreased in length, 0.4 x 0.8 cm, with fat layer exposed.  Minimum inflammation associated with the open ulcer. No active drainage noted.  Extremely dry and scaly skin lower limbs bilateral.  Vascular: Pedal pulses are not palpable bilateral.  Orthopedic: All digits are hyperextended on lesser digits. 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  Inverted forefoot with load bearing under 5th MPJ left foot.  Neurologic: Paresthesia with loss of normal tactile sensations on both feet.   Assessment: Improving wound with decreased wound size,0.4 x 0.8 cm open base covered with blistered callus.  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 Diabetic neuropathy lower limbs.  PVD bilateral. Pain in lower limb.   Plan: Continue with off loading.  Both feet soaked and scrubbed dry scaly skin. Left foot 5th MPJ area debrided and padded with1/4" Felt aperture pad with Amerigel dressing. Continue with ankle brace to prevent ankle sprain. Aperture pad placed in flat Surgical shoe without insole.  Remove all dressings and cleanse well before returning for the next week.  Return in 2 weeks.

## 2016-07-04 NOTE — Patient Instructions (Signed)
Follow up on left foot ulcer. Improvement noted. Return in 2 weeks. Do dressing change next week at home with dispensed supply.

## 2016-07-05 ENCOUNTER — Encounter: Payer: Self-pay | Admitting: Podiatry

## 2016-07-18 ENCOUNTER — Ambulatory Visit (INDEPENDENT_AMBULATORY_CARE_PROVIDER_SITE_OTHER): Payer: BC Managed Care – PPO | Admitting: Podiatry

## 2016-07-18 DIAGNOSIS — L97522 Non-pressure chronic ulcer of other part of left foot with fat layer exposed: Secondary | ICD-10-CM | POA: Diagnosis not present

## 2016-07-18 DIAGNOSIS — E0842 Diabetes mellitus due to underlying condition with diabetic polyneuropathy: Secondary | ICD-10-CM | POA: Diagnosis not present

## 2016-07-18 DIAGNOSIS — L97521 Non-pressure chronic ulcer of other part of left foot limited to breakdown of skin: Secondary | ICD-10-CM

## 2016-07-18 DIAGNOSIS — M21969 Unspecified acquired deformity of unspecified lower leg: Secondary | ICD-10-CM

## 2016-07-18 NOTE — Patient Instructions (Signed)
Follow up on left foot ulcer. Slow improving lesion. Debrided and padded. Return in 2 weeks.

## 2016-07-18 NOTE — Progress Notes (Signed)
Subjective: 56year old diabetic patient presents for follow up on left foot ulcer under 5th MPJ area left foot. Stated that Ankle brace is doing good keeping the ankle straighter.  Been following every 2 weeks now.   Objective:  Dermatologic: Recurrent ulcer under 5th MPJ left.  Wound size remain the same as the last visit  (0.4 x 0.8)but the depth has decreased in length. Limited to dermal layer now. Minimum inflammation associated with the open ulcer. No active drainage noted.  Extremely dry and scaly skin lower limbs bilateral.  Vascular: Pedal pulses are not palpable bilateral.  Orthopedic: All digits are hyperextended on lesser digits. 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  Inverted forefoot with load bearing under 5th MPJ left foot.  Neurologic: Paresthesia with loss of normal tactile sensations on both feet.   Assessment: Improving wound with decreasing wound size,0.4 x 0.8cm with shallow wound base. 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 Diabetic neuropathy lower limbs.  PVD bilateral. Pain in lower limb.   Plan: Continue with off loading.  Both feet soaked and scrubbed dry scaly skin. Left foot 5th MPJ area debrided and padded with1/4" Felt aperture pad with Amerigel dressing. Continue with ankle brace to prevent ankle sprain. Aperture pad placed in flat Surgical shoe without insole.  Remove all dressings and cleanse well before returning for the next week.  Return in 2 weeks.

## 2016-07-19 ENCOUNTER — Encounter: Payer: Self-pay | Admitting: Podiatry

## 2016-08-01 ENCOUNTER — Ambulatory Visit: Payer: BC Managed Care – PPO | Admitting: Podiatry

## 2016-08-02 ENCOUNTER — Ambulatory Visit (INDEPENDENT_AMBULATORY_CARE_PROVIDER_SITE_OTHER): Payer: BC Managed Care – PPO | Admitting: Podiatry

## 2016-08-02 ENCOUNTER — Encounter: Payer: Self-pay | Admitting: Podiatry

## 2016-08-02 DIAGNOSIS — L97521 Non-pressure chronic ulcer of other part of left foot limited to breakdown of skin: Secondary | ICD-10-CM | POA: Diagnosis not present

## 2016-08-02 DIAGNOSIS — E0842 Diabetes mellitus due to underlying condition with diabetic polyneuropathy: Secondary | ICD-10-CM | POA: Diagnosis not present

## 2016-08-02 DIAGNOSIS — M21969 Unspecified acquired deformity of unspecified lower leg: Secondary | ICD-10-CM

## 2016-08-02 NOTE — Progress Notes (Signed)
Subjective: 56year old diabetic patient presents for follow up on left foot ulcer under 5th MPJ area left foot. Stated that Ankle brace is doing good keeping the ankle straighter. He can really tell if he is not on the Ankle brace.  Been following every 2 weeks now.   Objective:  Dermatologic: Recurrent ulcer under 5th MPJ left.  Wound size decreased to minimum opening.  Minimum inflammation associated with the open ulcer. No active drainage noted.  Extremely dry and scaly skin lower limbs bilateral.  Vascular: Pedal pulses are not palpable bilateral.  Orthopedic: All digits are hyperextended on lesser digits. 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  Inverted forefoot with load bearing under 5th MPJ left foot.  Neurologic: Paresthesia with loss of normal tactile sensations on both feet.   Assessment: Improving wound with minimum opening.  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 Diabetic neuropathy lower limbs.  PVD bilateral. Pain in lower limb.   Plan: Continue with off loading.  Both feet soaked and scrubbed dry scaly skin. Left foot 5th MPJ area debrided and padded with1/4" Felt aperture pad with Amerigel dressing. Continue with ankle brace to prevent ankle sprain. Aperture pad placed in flat Surgical shoe without insole.  Remove all dressings and cleanse well before returning for the next week.  Return in 2 weeks.

## 2016-08-02 NOTE — Patient Instructions (Signed)
Wound healing in progress. Continue current level of care. Return in 2 weeks.

## 2016-08-16 ENCOUNTER — Ambulatory Visit (INDEPENDENT_AMBULATORY_CARE_PROVIDER_SITE_OTHER): Payer: BC Managed Care – PPO | Admitting: Podiatry

## 2016-08-16 ENCOUNTER — Encounter: Payer: Self-pay | Admitting: Podiatry

## 2016-08-16 DIAGNOSIS — M21969 Unspecified acquired deformity of unspecified lower leg: Secondary | ICD-10-CM

## 2016-08-16 DIAGNOSIS — E0842 Diabetes mellitus due to underlying condition with diabetic polyneuropathy: Secondary | ICD-10-CM | POA: Diagnosis not present

## 2016-08-16 DIAGNOSIS — B351 Tinea unguium: Secondary | ICD-10-CM

## 2016-08-16 DIAGNOSIS — M79671 Pain in right foot: Secondary | ICD-10-CM | POA: Diagnosis not present

## 2016-08-16 DIAGNOSIS — L97521 Non-pressure chronic ulcer of other part of left foot limited to breakdown of skin: Secondary | ICD-10-CM | POA: Diagnosis not present

## 2016-08-16 DIAGNOSIS — M79672 Pain in left foot: Secondary | ICD-10-CM

## 2016-08-16 NOTE — Patient Instructions (Signed)
Seen for follow up on left foot lesion and hypertrophic nails. Left foot lesion debrided and padded. All nails debrided. Return in 3 months or as needed.

## 2016-08-16 NOTE — Progress Notes (Signed)
Subjective: [age]year old diabetic patient presents for follow up on left foot ulcer under 5th MPJ area left foot. Stated that Ankle brace is doing good keeping the ankle straighter. He can really tell if he is not on the Ankle brace.  Been following every 2 weeks now.   Objective:  Dermatologic: Recurrent ulcer under 5th MPJ left continue to improve.   Minimum inflammation associated with the open ulcer. No active drainage noted.  Extremely dry and scaly skin lower limbs bilateral.  Thick dystrophic nails x 10. Vascular: Pedal pulses are not palpable bilateral.  Orthopedic: All digits are hyperextended on lesser digits. 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  Inverted forefoot with load bearing under 5th MPJ left foot.  Neurologic: Paresthesia with loss of normal tactile sensations on both feet.   Assessment: Improving wound with minimum opening.  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 Diabetic neuropathy lower limbs.  PVD bilateral. Onychomycosis. Pain in lower limb.   Plan: Continue with off loading.  Both feet soaked and scrubbed dry scaly skin. Left foot 5th MPJ area debrided and padded with1/4" Felt aperture pad with Amerigel dressing. Continue with ankle brace to prevent ankle sprain. Aperture pad placed in flat Surgical shoe without insole.  Remove all dressings and cleanse well before returning for the next week.  Return in 2 weeks.

## 2016-08-30 ENCOUNTER — Ambulatory Visit (INDEPENDENT_AMBULATORY_CARE_PROVIDER_SITE_OTHER): Payer: BC Managed Care – PPO | Admitting: Podiatry

## 2016-08-30 DIAGNOSIS — M21969 Unspecified acquired deformity of unspecified lower leg: Secondary | ICD-10-CM

## 2016-08-30 DIAGNOSIS — L97521 Non-pressure chronic ulcer of other part of left foot limited to breakdown of skin: Secondary | ICD-10-CM | POA: Diagnosis not present

## 2016-08-30 DIAGNOSIS — E0842 Diabetes mellitus due to underlying condition with diabetic polyneuropathy: Secondary | ICD-10-CM | POA: Diagnosis not present

## 2016-08-30 NOTE — Progress Notes (Signed)
Subjective: [age]year old diabetic patient presents for follow up on left foot ulcer under 5th MPJ area left foot. Patient is wearing regular shoes with Ankle brace on left foot.  He can really tell the brace is helping.  Objective:  Dermatologic: Recurrent ulcer under 5th MPJ left continue to improve.   Base opening has closed off with thin layer of new dermal tissue growth. Extremely dry and scaly skin lower limbs bilateral.  Vascular: Pedal pulses are not palpable bilateral.  Orthopedic: All digits are hyperextended on lesser digits. 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  Inverted forefoot with load bearing under 5th MPJ left foot.  Neurologic: Paresthesia with loss of normal tactile sensations on both feet.   Assessment: Improving wound with closed base. 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 Cavovarus foot bilateral. Diabetic neuropathy lower limbs.  PVD bilateral. Pain in lower limb.   Plan: Continue with off loading as much as possible. Both feet soaked and scrubbed dry scaly skin. Left foot 5th MPJ area debrided and padded with1/4" Felt aperture pad with Amerigel dressing. Continue with ankle brace to prevent ankle sprain. Dispensed extra sets of padding to change at home. Remove all dressings and cleanse well before returning for the next week.  Return in 2 weeks.

## 2016-08-31 ENCOUNTER — Encounter: Payer: Self-pay | Admitting: Podiatry

## 2016-08-31 NOTE — Patient Instructions (Signed)
Follow up on left foot ulcer. Continues to improve and doing well with Ankle brace. Follow home care instruction. Return in 2 weeks.

## 2016-09-13 ENCOUNTER — Ambulatory Visit (INDEPENDENT_AMBULATORY_CARE_PROVIDER_SITE_OTHER): Payer: BC Managed Care – PPO | Admitting: Podiatry

## 2016-09-13 ENCOUNTER — Encounter: Payer: Self-pay | Admitting: Podiatry

## 2016-09-13 DIAGNOSIS — M79604 Pain in right leg: Secondary | ICD-10-CM | POA: Diagnosis not present

## 2016-09-13 DIAGNOSIS — E0842 Diabetes mellitus due to underlying condition with diabetic polyneuropathy: Secondary | ICD-10-CM | POA: Diagnosis not present

## 2016-09-13 DIAGNOSIS — M216X2 Other acquired deformities of left foot: Secondary | ICD-10-CM

## 2016-09-13 DIAGNOSIS — L97521 Non-pressure chronic ulcer of other part of left foot limited to breakdown of skin: Secondary | ICD-10-CM

## 2016-09-13 DIAGNOSIS — M79605 Pain in left leg: Secondary | ICD-10-CM

## 2016-09-13 NOTE — Progress Notes (Signed)
Subjective: [age]year old diabetic patient presents for follow up on left foot ulcer under 5th MPJ area left foot. Patient is wearing regular shoes with Ankle brace on left foot.   Objective:  Dermatologic: Recurrent ulcer under 5th MPJ left continue to improve.  Base opening has closed off with thin layer of new dermal tissue growth. Extremely dry and scaly skin lower limbs bilateral.  Vascular: Pedal pulses are not palpable bilateral.  Orthopedic: All digits are hyperextended on lesser digits. 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  Inverted forefoot with load bearing under 5th MPJ left foot.  Neurologic: Paresthesia with loss of normal tactile sensations on both feet.   Assessment: Improving wound with closed base. 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 Cavovarus foot bilateral. Diabetic neuropathy lower limbs.  PVD bilateral. Pain in lower limb.   Plan: Continue with off loading as much as possible. Both feet soaked and scrubbed dry scaly skin. Left foot 5th MPJ area debrided and padded with1/4" Felt aperture pad with Amerigel dressing. Continue with ankle brace to prevent ankle sprain. Dispensed extra sets of padding to change at home. Remove all dressings and cleanse well before returning for the next week.  Return in 2 weeks. +

## 2016-09-13 NOTE — Patient Instructions (Signed)
Follow up on left foot ulcer. Making improvement.  Continue with Ankle brace and padding. Return in 2 weeks.

## 2016-09-27 ENCOUNTER — Ambulatory Visit (INDEPENDENT_AMBULATORY_CARE_PROVIDER_SITE_OTHER): Payer: BC Managed Care – PPO | Admitting: Podiatry

## 2016-09-27 DIAGNOSIS — L97521 Non-pressure chronic ulcer of other part of left foot limited to breakdown of skin: Secondary | ICD-10-CM

## 2016-09-27 DIAGNOSIS — E0842 Diabetes mellitus due to underlying condition with diabetic polyneuropathy: Secondary | ICD-10-CM | POA: Diagnosis not present

## 2016-09-27 DIAGNOSIS — R234 Changes in skin texture: Secondary | ICD-10-CM | POA: Diagnosis not present

## 2016-09-28 ENCOUNTER — Encounter: Payer: Self-pay | Admitting: Podiatry

## 2016-09-28 NOTE — Progress Notes (Signed)
Subjective: [age]year old diabetic patient presents for follow up on left foot ulcer under 5th MPJ area left foot. Patient came in with slippers.   Objective:  Dermatologic: Positive of fissuring heel callus plantar posterior left heel. Recurrent ulcer under 5th MPJ left continue to improve.  Base opening has closed off with thin layer of new dermal tissue growth. Extremely dry and scaly skin lower limbs bilateral.  Vascular: Pedal pulses are not palpable bilateral.  Orthopedic: All digits are hyperextended on lesser digits. 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  Inverted forefoot with load bearing under 5th MPJ left foot.  Neurologic: Paresthesia with loss of normal tactile sensations on both feet.   Assessment: Re opening heel fissure with callus build up. Improving wound with closed base. 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 Cavovarus foot bilateral. Diabetic neuropathy lower limbs.  PVD bilateral. Pain in lower limb.   Plan: Continue with off loading as much as possible. Both feet soaked and scrubbed dry scaly skin. Fissured area debrided and Amerigel ointment dressing applied. Left foot 5th MPJ area debrided and padded with1/4" Felt aperture pad with Amerigel dressing. Continue with ankle brace to prevent ankle sprain. Dispensed extra sets of padding to change at home. Remove all dressings and cleanse well before returning for the next week.  Return in 2 weeks.

## 2016-09-28 NOTE — Patient Instructions (Signed)
Seen for left foot ulcer.  Noted of new fissuring heel callus left heel. All lesions debrided and padded. Home care instruction with supply dispensed. Return in 2 weeks.

## 2016-10-11 ENCOUNTER — Encounter: Payer: Self-pay | Admitting: Podiatry

## 2016-10-11 ENCOUNTER — Ambulatory Visit (INDEPENDENT_AMBULATORY_CARE_PROVIDER_SITE_OTHER): Payer: BC Managed Care – PPO | Admitting: Podiatry

## 2016-10-11 DIAGNOSIS — R234 Changes in skin texture: Secondary | ICD-10-CM | POA: Diagnosis not present

## 2016-10-11 DIAGNOSIS — M79671 Pain in right foot: Secondary | ICD-10-CM | POA: Diagnosis not present

## 2016-10-11 DIAGNOSIS — L97521 Non-pressure chronic ulcer of other part of left foot limited to breakdown of skin: Secondary | ICD-10-CM | POA: Diagnosis not present

## 2016-10-11 DIAGNOSIS — M216X2 Other acquired deformities of left foot: Secondary | ICD-10-CM

## 2016-10-11 DIAGNOSIS — M79672 Pain in left foot: Secondary | ICD-10-CM

## 2016-10-11 DIAGNOSIS — M79606 Pain in leg, unspecified: Secondary | ICD-10-CM

## 2016-10-11 DIAGNOSIS — B351 Tinea unguium: Secondary | ICD-10-CM

## 2016-10-11 NOTE — Progress Notes (Signed)
Subjective: [age]year old diabetic patient presents for follow up on left foot ulcer under 5th MPJ area left foot, and to prepare for custom Ritch brace left lower limb. He was using ankle brace that was helping and able to wear in closed in shoes. He is also needing nails trimmed.  Objective:  Dermatologic: Positive of fissuring heel callus plantar posterior bilateral heel. Recurrent ulcer under 5th MPJ left continue to improve.  Base opening has closed off with thin layer of new dermal tissue growth. Extremely dry and scaly skin lower limbs bilateral.  Vascular: Pedal pulses are not palpable bilateral.  Orthopedic: All digits are hyperextended on lesser digits. Severe high arched instep L>R with severe Calcaneal varus L>R. Inverted forefoot with load bearing under 5th MPJ left foot.  Neurologic: Paresthesia with loss of normal tactile sensations on both feet.   Assessment: Re opening heel fissure with callus build up. Improving wound with closed base. Hypertrophic nails x 10. 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 Cavovarus foot bilateral. Diabetic neuropathy lower limbs.  PVD bilateral. Pain in lower limb.   Plan: Continue with off loading as much as possible. Both feet soaked and scrubbed dry scaly skin. Left lower limb negative impression taken with plaster of paris. Fissured area debrided on both heels. Left foot 5th MPJ area debrided and padded with1/4" Felt aperture pad with Amerigel dressing. Continue with ankle brace to prevent ankle sprain. Dispensed extra sets of padding to change at home. Remove all dressings and cleanse well before returning.  Return in 3 weeks.

## 2016-10-11 NOTE — Patient Instructions (Signed)
Follow up on left foot ulcer. Wound is doing well without open skin. All nails debrided. Left lower limb casted for Ritch brace. Return in 3 weeks.

## 2016-10-18 ENCOUNTER — Encounter: Payer: Self-pay | Admitting: Podiatry

## 2016-10-18 ENCOUNTER — Ambulatory Visit (INDEPENDENT_AMBULATORY_CARE_PROVIDER_SITE_OTHER): Payer: BC Managed Care – PPO | Admitting: Podiatry

## 2016-10-18 DIAGNOSIS — M216X2 Other acquired deformities of left foot: Secondary | ICD-10-CM

## 2016-10-18 NOTE — Progress Notes (Signed)
Repeat left lower limb casted for Ritch brace.

## 2016-10-18 NOTE — Patient Instructions (Signed)
Left lower limb impression taken for Ritch brace.

## 2016-11-01 ENCOUNTER — Encounter: Payer: Self-pay | Admitting: Podiatry

## 2016-11-01 ENCOUNTER — Ambulatory Visit (INDEPENDENT_AMBULATORY_CARE_PROVIDER_SITE_OTHER): Payer: BC Managed Care – PPO | Admitting: Podiatry

## 2016-11-01 DIAGNOSIS — L97521 Non-pressure chronic ulcer of other part of left foot limited to breakdown of skin: Secondary | ICD-10-CM | POA: Diagnosis not present

## 2016-11-01 DIAGNOSIS — E0842 Diabetes mellitus due to underlying condition with diabetic polyneuropathy: Secondary | ICD-10-CM | POA: Diagnosis not present

## 2016-11-01 DIAGNOSIS — R234 Changes in skin texture: Secondary | ICD-10-CM | POA: Diagnosis not present

## 2016-11-01 NOTE — Patient Instructions (Addendum)
Seen for follow up on left foot ulcer. Wound healing complete with residual callus build up. Continue with current level of care. Return in 4 weeks.

## 2016-11-01 NOTE — Progress Notes (Signed)
Subjective: 56year old diabetic patient presents for follow up on left foot ulcer under 5th MPJ area left foot. Patient uses left ankle brace in lace up shoe.  Objective:  Dermatologic: Positive of fissuring heel callus plantar posterior left heel. Recurrent ulcer under 5th MPJ left continue to improve.  Base opening has closed off with thin layer of new dermal tissue growth. Extremely dry and scaly skin lower limbs bilateral.  Vascular: Pedal pulses are not palpable bilateral.  Orthopedic: All digits are hyperextended on lesser digits. 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  Inverted forefoot with load bearing under 5th MPJ left foot.  Neurologic: Paresthesia with loss of normal tactile sensations on both feet.   Assessment: Re opening heel fissure with callus build up. Improving wound with closed base. 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 Cavovarus foot bilateral. Diabetic neuropathy lower limbs.  PVD bilateral. Pain in lower limb.   Plan: Continue with off loading as much as possible. Both feet soaked and scrubbed dry scaly skin. Fissured area debrided and Amerigel ointment dressing applied. Left foot 5th MPJ area debrided and padded with1/4" Felt aperture pad with Amerigel dressing. Continue with ankle brace to prevent ankle sprain. Dispensed extra sets of padding to change at home. Remove all dressings and cleanse well before returning for the next week.  Return in 4 weeks.

## 2016-11-29 ENCOUNTER — Encounter: Payer: Self-pay | Admitting: Podiatry

## 2016-11-29 ENCOUNTER — Ambulatory Visit (INDEPENDENT_AMBULATORY_CARE_PROVIDER_SITE_OTHER): Payer: BC Managed Care – PPO | Admitting: Podiatry

## 2016-11-29 DIAGNOSIS — L97521 Non-pressure chronic ulcer of other part of left foot limited to breakdown of skin: Secondary | ICD-10-CM | POA: Diagnosis not present

## 2016-11-29 DIAGNOSIS — E0842 Diabetes mellitus due to underlying condition with diabetic polyneuropathy: Secondary | ICD-10-CM

## 2016-11-29 DIAGNOSIS — R234 Changes in skin texture: Secondary | ICD-10-CM | POA: Diagnosis not present

## 2016-11-29 DIAGNOSIS — M21969 Unspecified acquired deformity of unspecified lower leg: Secondary | ICD-10-CM

## 2016-11-29 NOTE — Patient Instructions (Signed)
Follow up on left foot lesion. Debrided and padded.  Doing well with Ritch brace. Return in 4 weeks.

## 2016-11-29 NOTE — Progress Notes (Signed)
Subjective: 56year old diabetic patient presents for follow up on left foot ulcer under 5th MPJ area left foot. Patient is using Ritch brace on left foot. Stated that he can be on feet longer hours with the brace.   Objective:  Dermatologic: Thick dystrophic nails x 10. Recurrent ulcer under 5th MPJ left continue to improve.  Extremely dry and scaly skin lower limbs bilateral.  Vascular: Pedal pulses are not palpable bilateral.  Orthopedic: All digits are hyperextended on lesser digits. 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  Inverted forefoot with load bearing under 5th MPJ left foot.  Neurologic: Paresthesia with loss of normal tactile sensations on both feet.   Assessment: Improving wound with closed base under 5th MPJ left foot. 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 Cavovarus foot bilateral. Diabetic neuropathy lower limbs.  PVD bilateral. Pain in lower limb.   Plan: Continue with off loading as much as possible. Both feet soaked and scrubbed dry scaly skin. All nails debrided. Left foot 5th MPJ area debrided and padded with1/4" Felt aperture pad with Amerigel dressing. Continue with Ritch brace for ambulation. Dispensed extra sets of padding to change at home. Return in 4 weeks.

## 2016-12-15 IMAGING — MR MR LUMBAR SPINE WO/W CM
4 of 8 series · 25 of 48 positions shown · IV contrast (15cc Multihance)
Comparison: MRI lumbar spine 08/01/2011.

CLINICAL DATA: Diffuse spine pain for 20 years. Numbness in both
feet. History of multiple prior lumbar surgeries, most recent 7
years ago. Subsequent encounter.

Creatinine was obtained on site at [HOSPITAL] at [HOSPITAL].
Results: Creatinine 1.1 mg/dL.
EXAM:
MRI LUMBAR SPINE WITHOUT AND WITH CONTRAST
TECHNIQUE: Multiplanar and multiecho pulse sequences of the lumbar spine were
obtained without and with intravenous contrast.
CONTRAST:  20 mL MULTIHANCE GADOBENATE DIMEGLUMINE 529 MG/ML IV SOLN

[Series 3: T1 · sagittal · 4.0mm · 0.57mm/px · 5 of 15 slices shown (1 of 2)]
[im 1/15]
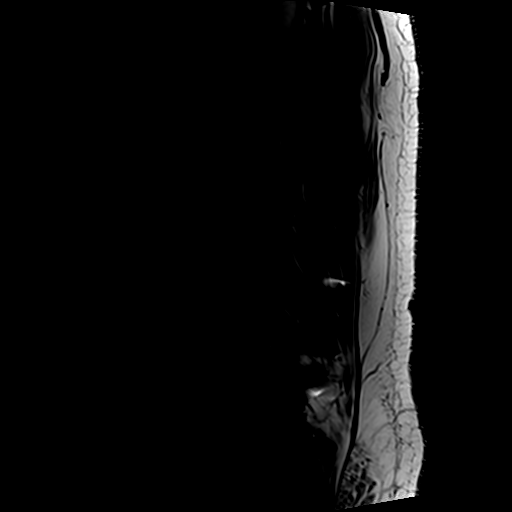
[im 4/15]
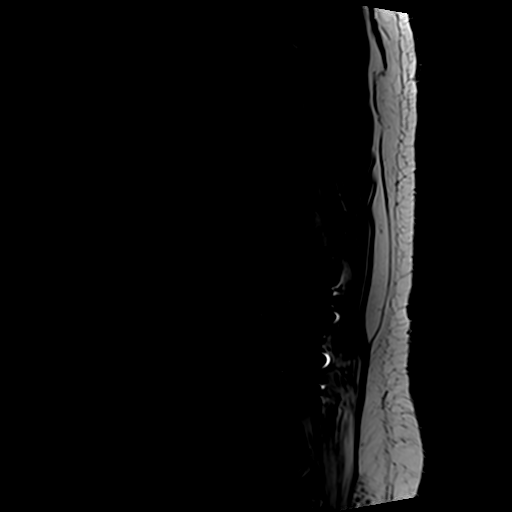
[im 8/15]
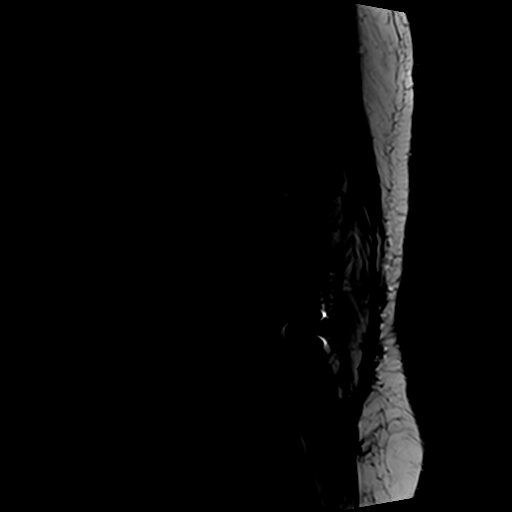
[im 11/15]
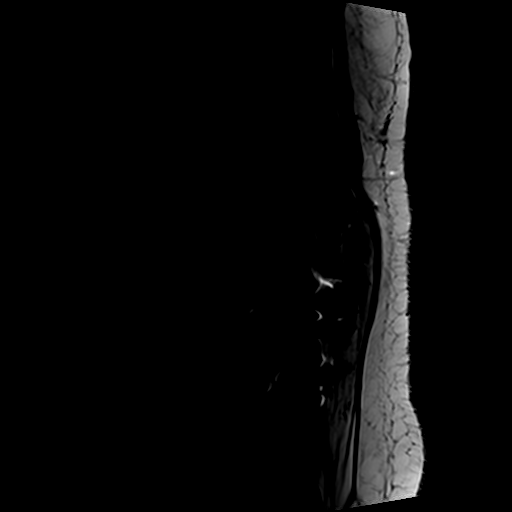
[im 15/15]
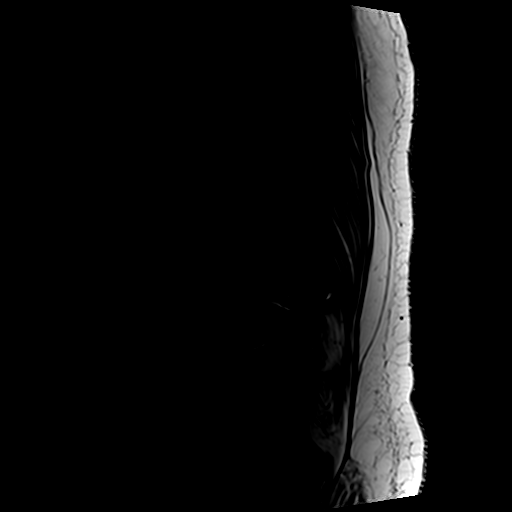

[Series 5: T2 · axial · 4.0mm · 0.70mm/px · z∈[-108,+75]mm · 9 of 34 slices shown (1 of 2)]
[im 1/34]
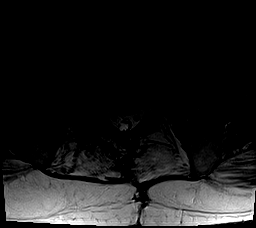
[im 5/34]
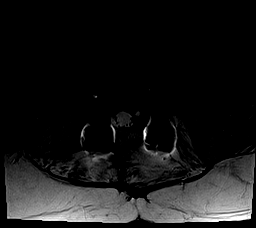
[im 9/34]
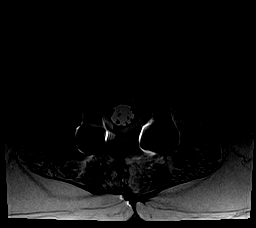
[im 13/34]
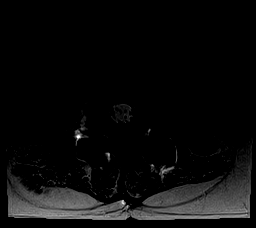
[im 17/34]
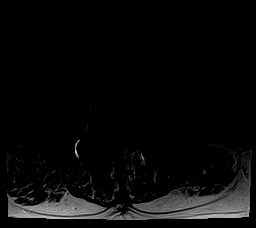
[im 21/34]
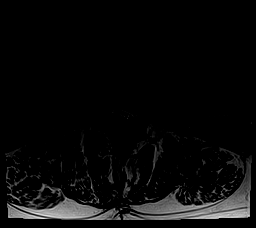
[im 25/34]
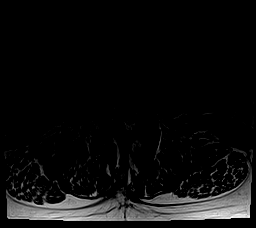
[im 29/34]
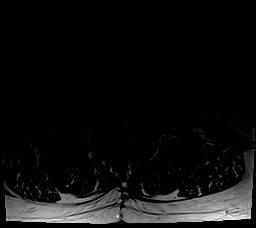
[im 34/34]
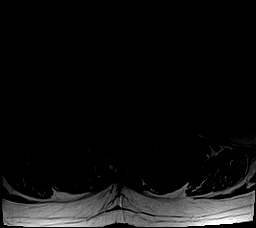

[Series 6: T1 · axial · 4.0mm · 0.35mm/px · z∈[-108,+49]mm · 7 of 34 slices shown (2 of 2)]
[im 1/34]
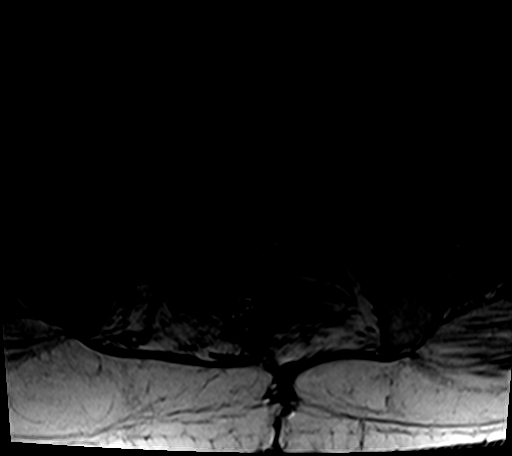
[im 5/34]
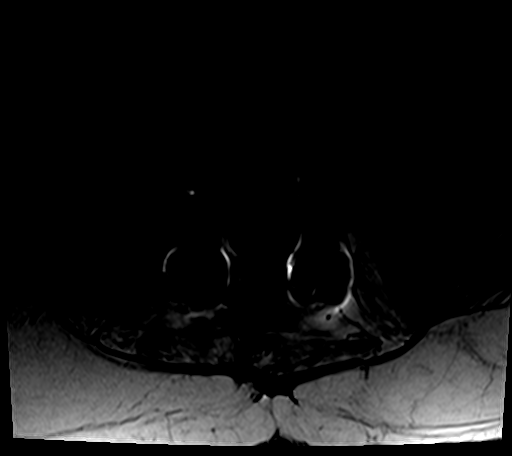
[im 9/34]
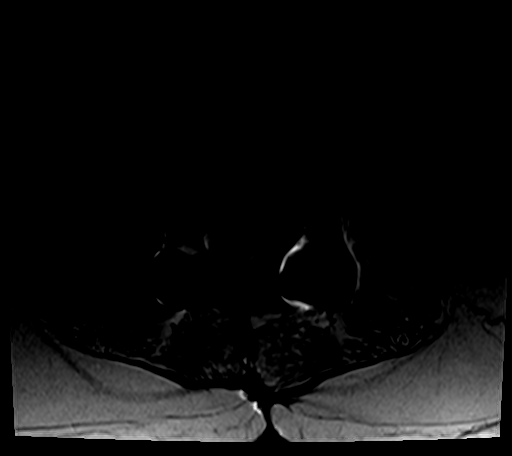
[im 13/34]
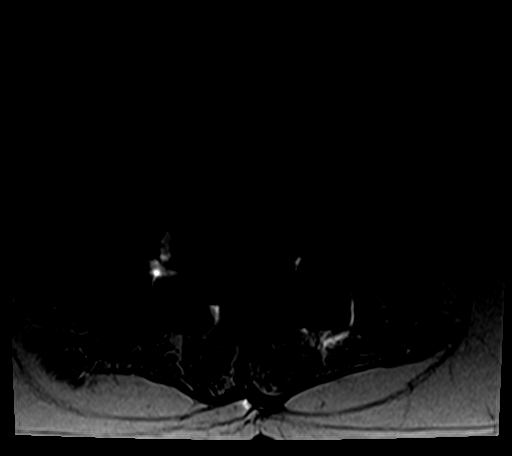
[im 17/34]
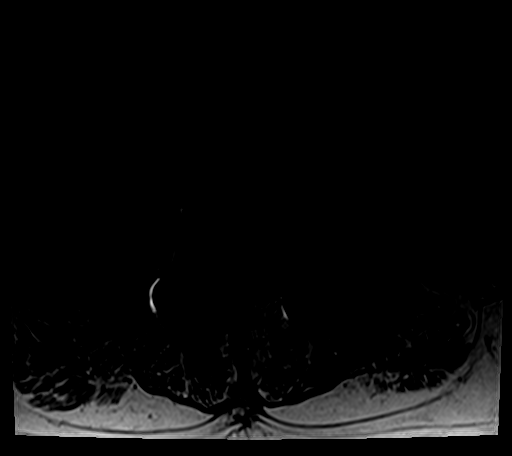
[im 21/34]
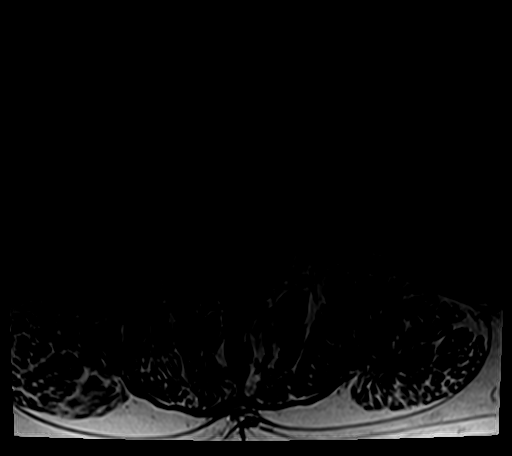
[im 29/34]
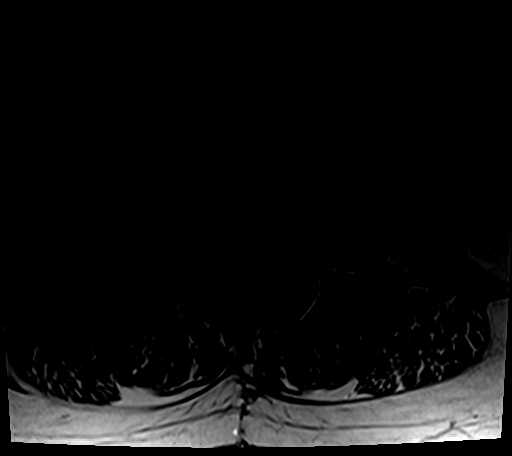

[Series 7: T2 · sagittal · 4.0mm · 0.57mm/px · 4 of 15 slices shown (2 of 2)]
[im 1/15]
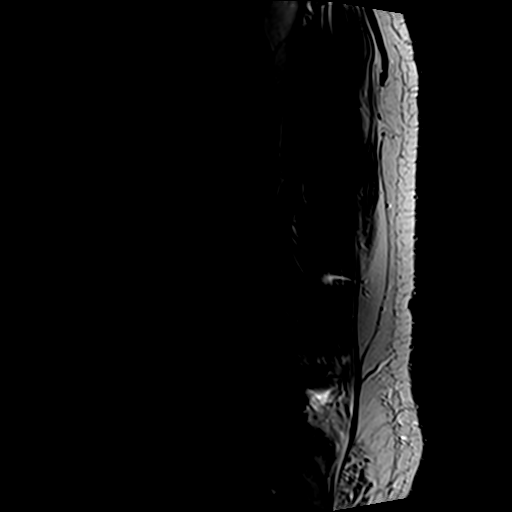
[im 5/15]
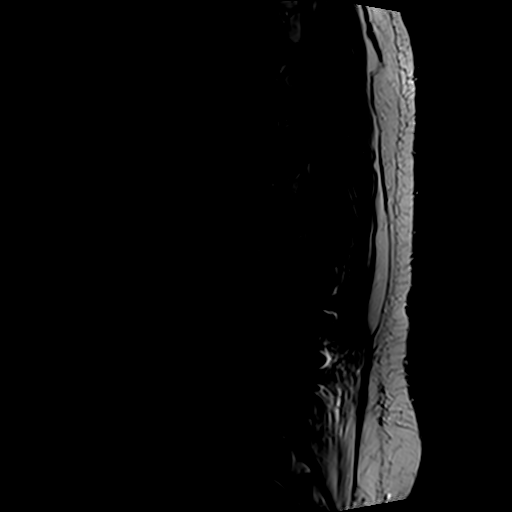
[im 10/15]
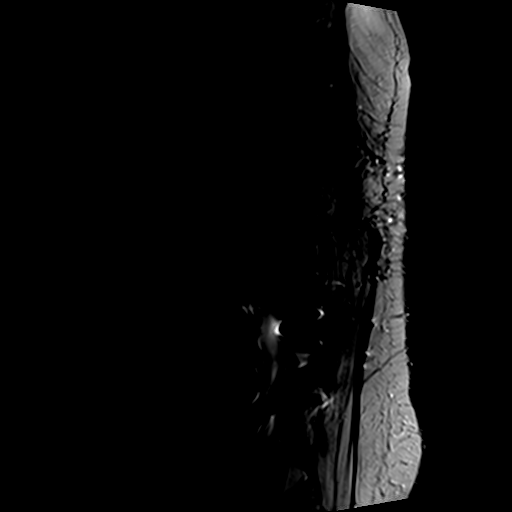
[im 15/15]
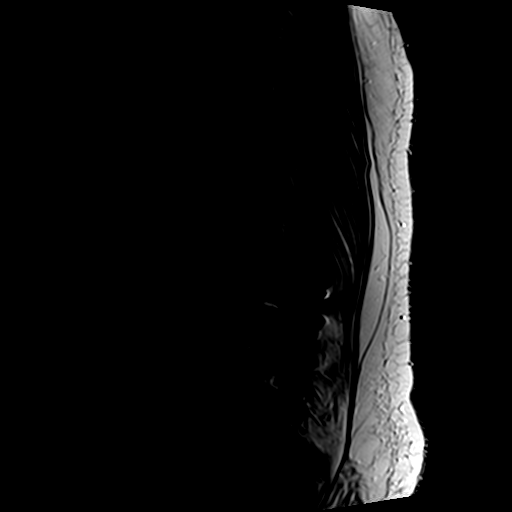

[25 of 48 positions shown; findings below may reference images not displayed]

FINDINGS: As on the prior examination, the patient is status post L4-S1
fusion. There is straightening of the normal lumbar lordosis.
Vertebral body height and alignment are maintained. Small hemangioma
in L3 is incidentally noted. There is no worrisome marrow lesion.
The conus medullaris is normal in signal and position. Imaged
intra-abdominal contents are unremarkable.

The T11-12 and T12-L1 levels are imaged in the sagittal plane only.
T11-12 is negative. Central and right paracentral disc protrusion
with cephalad extension is again seen at T12-L1. The disc appears
slightly smaller than on the prior study but does efface the ventral
thecal sac. The foramina appear widely patent at each level.

L1-2:  Negative.

L2-3: Disc protrusion centrally extending into the left lateral
recess appears unchanged. The disc deforms the left aspect of the
thecal sac and deflects the descending left L3 root. The patient is
status post left laminotomy at this level. The foramina are open.

L3-4: Facet arthropathy, annular fissure and shallow broad-based
disc bulge cause moderate central canal narrowing, unchanged. The
foramina are open.

L4-5: Status post laminectomy and fusion. The central canal and
foramina are open.

L5-S1: Status post laminectomy and fusion. The central canal and
foramina are open.
IMPRESSION: No new abnormality since the prior MRI.

Disc protrusion at T12-L1 is imaged in the sagittal plane only but
appears slightly smaller than on the prior MRI. The disc effaces the
ventral thecal sac.

Status post left laminotomy at L2-3. Central and to the left disc
protrusion with endplate spur causes moderate central canal
narrowing and deflects the descending left L3 root, unchanged.

No change in moderate central canal narrowing L3-4 where there is an
annular fissure and shallow broad-based disc bulge.

Status post L4-S1 laminectomy and fusion without evidence of
complication. The central canal and foramina are widely patent at
each level.

## 2016-12-29 ENCOUNTER — Ambulatory Visit: Payer: BC Managed Care – PPO | Admitting: Podiatry

## 2016-12-29 ENCOUNTER — Encounter: Payer: Self-pay | Admitting: Podiatry

## 2016-12-29 DIAGNOSIS — L97521 Non-pressure chronic ulcer of other part of left foot limited to breakdown of skin: Secondary | ICD-10-CM

## 2016-12-29 DIAGNOSIS — E0842 Diabetes mellitus due to underlying condition with diabetic polyneuropathy: Secondary | ICD-10-CM

## 2016-12-29 DIAGNOSIS — M21969 Unspecified acquired deformity of unspecified lower leg: Secondary | ICD-10-CM

## 2016-12-29 NOTE — Patient Instructions (Signed)
Follow up on left foot ulcer. Showing improvement. Lesion debrided and padded. Return in one month.

## 2016-12-29 NOTE — Progress Notes (Signed)
Subjective: 56year old diabetic patient presents for follow up on left foot ulcer under 5th MPJ area left foot. Denies any pain. Patient is using Ritch brace on left foot.   Objective:  Dermatologic: Recurrent ulcer under 5th MPJ left continue to improve with minimum intradermal bleeding..  Extremely dry and scaly skin lower limbs bilateral.  Vascular: Pedal pulses are not palpable bilateral.  Orthopedic: All digits are hyperextended on lesser digits. 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  Inverted forefoot with load bearing under 5th MPJ left foot.  Neurologic: Paresthesia with loss of normal tactile sensations on both feet.   Assessment: Improving wound with closed base under 5th MPJ left foot. 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 Cavovarus foot bilateral. Diabetic neuropathy lower limbs.  PVD bilateral. Pain in lower limb.   Plan: Continue with off loading as much as possible. Both feet soaked and scrubbed dry scaly skin. Left foot 5th MPJ area debrided and padded with1/4" Felt aperture pad with Amerigel dressing. Continue with Ritch brace for ambulation. Dispensed extra sets of padding to change at home. Return in 4weeks.

## 2017-01-26 ENCOUNTER — Ambulatory Visit (INDEPENDENT_AMBULATORY_CARE_PROVIDER_SITE_OTHER): Payer: BC Managed Care – PPO | Admitting: Podiatry

## 2017-01-26 ENCOUNTER — Encounter: Payer: Self-pay | Admitting: Podiatry

## 2017-01-26 DIAGNOSIS — E0842 Diabetes mellitus due to underlying condition with diabetic polyneuropathy: Secondary | ICD-10-CM | POA: Diagnosis not present

## 2017-01-26 DIAGNOSIS — M216X2 Other acquired deformities of left foot: Secondary | ICD-10-CM

## 2017-01-26 DIAGNOSIS — M21969 Unspecified acquired deformity of unspecified lower leg: Secondary | ICD-10-CM

## 2017-01-26 NOTE — Progress Notes (Signed)
Subjective: 56 y.o. year old male patient presents for follow up on left foot ulcer. He is using Ritchie brace on the affected left foot and doing better since.  History of lower back surgery with residual peripheral neuropathy bilateral lower limbs.  Objective: Dermatologic: improved ulcer under 5th MPJ left foot. Area is covered with thick broad callus covering about 2 cm in diameter under 5th MPJ left. No broken skin noted.  Vascular: Pedal pulses are not palpable. Thin shiny skin with poor texture and cracking skin both feet and leg. Orthopedic: High arched cavus type foot with severe rearfoot varus and medial column contracture bilateral L>R. Inverted forefoot on left. Neurologic: All epicritic and tactile sensations grossly intact.  Assessment: Satisfactory progress since adding Ritch brace on left foot. Healed ulcer under 5th MPJ left with callused skin replacement. Severe Cavovarus foot with lateral weight shifting bilateral. Diabetic under control. Peripheral neuropathy lower limbs. PVD bilateral. Pain in lower limbs.  Treatment: Callused sub 5 lesion left foot debrided after 15 minute soak. May leave off pad for the next 2-3 weeks. Home care instruction with supply dispensed. May return in 6 weeks.

## 2017-01-26 NOTE — Patient Instructions (Signed)
Follow up on left foot ulcer. Doing well. Debrided. May leave pad off for 2-3 more weeks. Return in 6 wees.

## 2017-03-09 ENCOUNTER — Encounter: Payer: Self-pay | Admitting: Podiatry

## 2017-03-09 ENCOUNTER — Ambulatory Visit (INDEPENDENT_AMBULATORY_CARE_PROVIDER_SITE_OTHER): Payer: BC Managed Care – PPO | Admitting: Podiatry

## 2017-03-09 DIAGNOSIS — E0842 Diabetes mellitus due to underlying condition with diabetic polyneuropathy: Secondary | ICD-10-CM

## 2017-03-09 DIAGNOSIS — L97521 Non-pressure chronic ulcer of other part of left foot limited to breakdown of skin: Secondary | ICD-10-CM

## 2017-03-09 DIAGNOSIS — M216X2 Other acquired deformities of left foot: Secondary | ICD-10-CM | POA: Diagnosis not present

## 2017-03-09 NOTE — Patient Instructions (Signed)
Seen for one month follow up on left foot lesion. Debrided all fissured callus on both heels and pre ulcerative keratotic tissue under 5th MPJ left. Aperture pad placed with Amerigel ointment dressing. Home care instruction with supply dispensed. Return in 1 mont

## 2017-03-09 NOTE — Progress Notes (Signed)
Subjective: 56 y.o. year old male patient presents for one month follow up on left foot ulcer. Still using Ritch brace and doing better since. Stated that the left foot has been hurting a lot for the past few days.  History of lower back surgery with residual peripheral neuropathy bilateral lower limbs.  Objective: Dermatologic: Pre ulcerative callus under 5th MPJ left without open skin. Keratotic tissue is covering the area about 2.5 cm in diameter. Severe dry peeling skin with cracking callus both heels plantar posterior. Vascular: Pedal pulses are not palpable. Orthopedic: Varus rotated high arched cavus foot with lateral weight shifting L>R. Neurologic: Abnormal sensation with hyperalgia and anesthesia. Diagnosed with peripheral neuropathy bilateral.  Assessment: Pre ulcerative callus under 5th MPJ left foot. Severe cavovarus foot deformity with lateral weight shifting. Peripheral neuropathy. Bilateral foot pain.  Treatment: Both feet soaked for 15 minutes. Debrided all fissured callus on both heels and pre ulcerative keratotic tissue under 5th MPJ left. Aperture pad placed with Amerigel ointment dressing. Home care instruction with supply dispensed. Return in 1 month.

## 2017-04-05 ENCOUNTER — Ambulatory Visit (INDEPENDENT_AMBULATORY_CARE_PROVIDER_SITE_OTHER): Payer: BC Managed Care – PPO | Admitting: Podiatry

## 2017-04-05 DIAGNOSIS — L97521 Non-pressure chronic ulcer of other part of left foot limited to breakdown of skin: Secondary | ICD-10-CM

## 2017-04-05 DIAGNOSIS — E0842 Diabetes mellitus due to underlying condition with diabetic polyneuropathy: Secondary | ICD-10-CM | POA: Diagnosis not present

## 2017-04-05 DIAGNOSIS — M216X2 Other acquired deformities of left foot: Secondary | ICD-10-CM | POA: Diagnosis not present

## 2017-04-06 ENCOUNTER — Encounter: Payer: Self-pay | Admitting: Podiatry

## 2017-04-06 NOTE — Patient Instructions (Signed)
Both feet pre ulcerative calluses and fissured heel lesions debrided and padded. Return in one month.

## 2017-04-06 NOTE — Progress Notes (Signed)
Subjective: 57 y.o. year old male patient presents for 3 weeks follow up on left foot ulcer. Continues to use brace on left foot. Stated that he was in hospital last week for acute cellulitis on right lower limb and on antibiotics. The swelling and redness has been subsided at this time.   Objective: Dermatologic: Old ulcerative lesion is covered with thick callus with mild intradermal bleeding. Fissured heel callus deep to subdermal layer left heel. Dry and brown scabs with scaling skin right lower limb. Vascular: Pedal pulses are not palpable. No acute edema or erythema noted. Orthopedic: High arched cavus type foot with varus rotated forefoot with lateral weight shifting. Neurologic: All epicritic and tactile sensations grossly intact.  Assessment: Pre ulcerative callus sub 5 left. Fissured callus deep to subdermal layer left heel. Cavovarus foot with lateral weight shifting. Peripheral neuropathy. Bilateral foot pain.  Treatment: Both feet soaked. Debrided all lesions. Aperture pad placed under left foot with home care instruction. Return in one month.

## 2017-04-26 ENCOUNTER — Ambulatory Visit (INDEPENDENT_AMBULATORY_CARE_PROVIDER_SITE_OTHER): Payer: BC Managed Care – PPO | Admitting: Podiatry

## 2017-04-26 ENCOUNTER — Encounter: Payer: Self-pay | Admitting: Podiatry

## 2017-04-26 DIAGNOSIS — M79605 Pain in left leg: Secondary | ICD-10-CM

## 2017-04-26 DIAGNOSIS — E0842 Diabetes mellitus due to underlying condition with diabetic polyneuropathy: Secondary | ICD-10-CM

## 2017-04-26 DIAGNOSIS — L97521 Non-pressure chronic ulcer of other part of left foot limited to breakdown of skin: Secondary | ICD-10-CM | POA: Diagnosis not present

## 2017-04-26 DIAGNOSIS — R234 Changes in skin texture: Secondary | ICD-10-CM | POA: Diagnosis not present

## 2017-04-26 NOTE — Patient Instructions (Signed)
Follow up on left foot ulcer. Fissured callus, pre ulcerative callus debrided and padded. Home instruction given with supplies. Continue with Ritch brace. Return in 3 weeks.

## 2017-04-26 NOTE — Progress Notes (Signed)
Subjective: 57 y.o. year old male patient presents for 2 weeks follow up on left foot ulcer. Just got back from a trip abroad.  Objective: Dermatologic: Deep fissuring heel callus left foot. Pre ulcerative callus sub 5 left foot without drainage. Dry scaling skin plantar bilateral. Vascular: Pedal pulses are not palpable. Orthopedic: Severe cavovarus foot bilateral. Neurologic: Subjective numbness and paresthesia bilateral.  Assessment: Fissured heel callus plantar posterior left heel. Pre ulcerative callus left. Cavovarus with lateral weight shifting bilateral. Peripheral neuropathy associated with S/P lower back surgery a decads ago. Bilateral foot pain.  Treatment: Both feet soaked and removed all dry scaly skin. Fissured callus, pre ulcerative callus debrided and padded. Home instruction given with supplies. Continue with Ritch brace. Return in 3 weeks.

## 2017-05-24 ENCOUNTER — Ambulatory Visit: Payer: BC Managed Care – PPO | Admitting: Podiatry

## 2017-05-25 ENCOUNTER — Ambulatory Visit: Payer: BC Managed Care – PPO | Admitting: Podiatry

## 2017-05-30 ENCOUNTER — Encounter: Payer: Self-pay | Admitting: Podiatry

## 2017-05-30 ENCOUNTER — Ambulatory Visit (INDEPENDENT_AMBULATORY_CARE_PROVIDER_SITE_OTHER): Payer: BC Managed Care – PPO | Admitting: Podiatry

## 2017-05-30 DIAGNOSIS — L97521 Non-pressure chronic ulcer of other part of left foot limited to breakdown of skin: Secondary | ICD-10-CM | POA: Diagnosis not present

## 2017-05-30 DIAGNOSIS — R234 Changes in skin texture: Secondary | ICD-10-CM | POA: Diagnosis not present

## 2017-05-30 DIAGNOSIS — B351 Tinea unguium: Secondary | ICD-10-CM | POA: Diagnosis not present

## 2017-05-30 DIAGNOSIS — E0842 Diabetes mellitus due to underlying condition with diabetic polyneuropathy: Secondary | ICD-10-CM

## 2017-05-30 NOTE — Progress Notes (Signed)
Subjective: 57 y.o. year old male patient presents for follow up on left foot ulcer.  Objective: Dermatologic: Healing ulcer sub 5 left foot. Deep fissured callus plantar posterior left heel. Thick dystrophic nails x 10. Thin, shiny, dry and scaly skin both lower limbs. Vascular: Pedal pulses are not palpable. Orthopedic: Severe Cavovarus bilateral. Neurologic:  Severe peripheral neuropathy.  Assessment: Dystrophic mycotic nails x 10. Fissured callus plantar left heel. Healing ulcer left foot. Cavovarus with lateral weight shifting bilateral. Peripheral neuropathy with S/P Lower back surgery over 10 years ago. Bilateral foot pain.  Treatment: Both feet soaked and removed much of dry scaly skin. Fissured callus and callused ulcer debrided. All nails debrided. Continue with Ritch brace. Return in one month.

## 2017-05-30 NOTE — Patient Instructions (Signed)
Seen for follow up on foot ulcer. Noted of improved left foot lesion but has a large cracked fissure under left heel. All lesions and nails debrided.

## 2017-06-27 ENCOUNTER — Ambulatory Visit: Payer: BC Managed Care – PPO | Admitting: Podiatry

## 2017-06-29 ENCOUNTER — Encounter: Payer: Self-pay | Admitting: Podiatry

## 2017-06-29 ENCOUNTER — Ambulatory Visit (INDEPENDENT_AMBULATORY_CARE_PROVIDER_SITE_OTHER): Payer: BC Managed Care – PPO | Admitting: Podiatry

## 2017-06-29 DIAGNOSIS — M21969 Unspecified acquired deformity of unspecified lower leg: Secondary | ICD-10-CM

## 2017-06-29 DIAGNOSIS — R234 Changes in skin texture: Secondary | ICD-10-CM | POA: Diagnosis not present

## 2017-06-29 DIAGNOSIS — M216X2 Other acquired deformities of left foot: Secondary | ICD-10-CM

## 2017-06-29 DIAGNOSIS — E0842 Diabetes mellitus due to underlying condition with diabetic polyneuropathy: Secondary | ICD-10-CM

## 2017-06-29 NOTE — Patient Instructions (Signed)
Follow up on left foot ulcer. Doing well without broken skin. Both feet soaked and debrided all fissured calluses on both heels. Return in 6 weeks.

## 2017-06-29 NOTE — Progress Notes (Signed)
Subjective: 57 y.o. year old male patient presents for follow up on left foot ulcer and plantar heel calluses.  Objective: Dermatologic: healed left foot ulcer. Deep fissured heel calluses bilateral. Dry scally skin both lower limbs. Vascular: Pedal pulses are not palpable. Orthopedic: Severe cavovarus foot bilateral. Neurologic: Severe peripheral neuropathy.  Assessment: Fissured heel callus bilateral. Plantar callus sub 5 at old ulcer site. Cavovarus foot with lateral weight shifting bilatral. Peripheral neuropathy bilateral. Pain in both feet.  Treatment: Both feet soaked and removed dry scaly skin from plantar aspect both feet. Fissured heel calluses debrided. Continue with Ritch brace. Return in 6 weeks.

## 2017-09-07 ENCOUNTER — Ambulatory Visit: Payer: BC Managed Care – PPO | Admitting: Podiatry

## 2017-09-07 ENCOUNTER — Encounter: Payer: Self-pay | Admitting: Podiatry

## 2017-09-07 DIAGNOSIS — E0842 Diabetes mellitus due to underlying condition with diabetic polyneuropathy: Secondary | ICD-10-CM

## 2017-09-07 DIAGNOSIS — L97521 Non-pressure chronic ulcer of other part of left foot limited to breakdown of skin: Secondary | ICD-10-CM

## 2017-09-07 DIAGNOSIS — B351 Tinea unguium: Secondary | ICD-10-CM

## 2017-09-07 DIAGNOSIS — R234 Changes in skin texture: Secondary | ICD-10-CM

## 2017-09-07 NOTE — Patient Instructions (Signed)
Follow up on pre ulcerative lesion, fissured callus and toe nails. All debrided and Vitamin A cream applied. Return in 6 weeks.

## 2017-09-07 NOTE — Progress Notes (Signed)
Subjective: 57 y.o. year old male patient presents for follow up on recurring ulcer and fissuring heel calluses.  Objective: Dermatologic: Healed ulcer under 5th MPJ left. Deep fissured heel calluses bilateral. Dry scaly skin both lower limbs. Vascular: Pedal pulses are not palpable. Orthopedic: Cavovarus foot with lateral weight shifting bilateral. Neurologic: Diagnosed with peripheral neuropathy both lower limbs.  Assessment: Dystrophic mycotic nails x 10. Fissured heel callus bilateral. Plantar pre ulcerative callus sub 5 left. Peripheral neuropathy. Pain in both feet.  Treatment: Both feet soaked for 15 minutes. All mycotic nails, fissured calluses and pre ulcerative callused sub 5 left foot debrided.  Return in 6 weeks or sooner if needed.

## 2017-10-03 ENCOUNTER — Encounter: Payer: Self-pay | Admitting: Podiatry

## 2017-10-03 ENCOUNTER — Ambulatory Visit: Payer: BC Managed Care – PPO | Admitting: Podiatry

## 2017-10-03 DIAGNOSIS — L97521 Non-pressure chronic ulcer of other part of left foot limited to breakdown of skin: Secondary | ICD-10-CM

## 2017-10-03 DIAGNOSIS — L03119 Cellulitis of unspecified part of limb: Secondary | ICD-10-CM | POA: Diagnosis not present

## 2017-10-03 DIAGNOSIS — L02619 Cutaneous abscess of unspecified foot: Secondary | ICD-10-CM

## 2017-10-03 DIAGNOSIS — E0842 Diabetes mellitus due to underlying condition with diabetic polyneuropathy: Secondary | ICD-10-CM | POA: Diagnosis not present

## 2017-10-03 NOTE — Patient Instructions (Signed)
Follow up from ER visit. Had I&D and wound packing over weekend. Packing removed. Debrided devitalized tissue. Aperture pad placed with Amerigel ointment dressing. Keep the padding and change dressing daily. Return in one week.

## 2017-10-03 NOTE — Progress Notes (Signed)
Subjective: Stated that his wife noted of infected draining foot. He checked in ER over the weekend, and I&D was done and wound packing was placed. Patient is here to have the packing removed. He is placed on antibiotics for 5 days.  Objective: Subsided edema and cellulitis since ER visit. Left foot is in wound dressing with Xeroform gauze packed in wound dorsal aspect of 5th metatarsal head left foot. Dorsal woun is covered with dry thick, old blistered skin layer extending plantar laterally to the plantar callus area. Hard callused plantar sub 5 at old ulcer site.  Assessment: Subsiding celluitis left foot since ER visit. Ulcerated dorsolateral callus left 5th metatarsal head. Depth of wound limited to dermal layer.  Plan: Packing removed. Wound debrided off of all devitalized tissue. Noted of base wound has no drainage. Aperture pad placed and Amerigel ointment dressing placed with home care instruction.

## 2017-10-10 ENCOUNTER — Ambulatory Visit: Payer: BC Managed Care – PPO | Admitting: Podiatry

## 2017-10-10 ENCOUNTER — Encounter: Payer: Self-pay | Admitting: Podiatry

## 2017-10-10 DIAGNOSIS — L97521 Non-pressure chronic ulcer of other part of left foot limited to breakdown of skin: Secondary | ICD-10-CM

## 2017-10-10 DIAGNOSIS — E0842 Diabetes mellitus due to underlying condition with diabetic polyneuropathy: Secondary | ICD-10-CM

## 2017-10-10 DIAGNOSIS — M21969 Unspecified acquired deformity of unspecified lower leg: Secondary | ICD-10-CM | POA: Diagnosis not present

## 2017-10-10 NOTE — Patient Instructions (Signed)
Follow up on left foot ulcer. Improvement noted. Debrided and padded.  Keep the pad on till the day before. Return in one week.

## 2017-10-10 NOTE — Progress Notes (Signed)
Subjective: 57 y.o. year old diabetic male patient presents for follow up on left foot infected blister. Patient kept the dressing and padding till yesterday as instructed.  Objective: Dermatologic: Thick broad callus surrounding ulcer at the base of the 5th MPJ left foot. No acute erythema or edema noted. Vascular: Pedal pulses are not palpable. 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 Neurologic: Peripheral neuropathy, diabetic.  Assessment: Ulcer under 5th MPJ left, limited to breakdown of skin. Thick callus surrounding the ulcer. Peripheral neuropathy. Cavovarus foot bilateral.  Treatment: Ulcerating callus debrided. 1/4" Felt pad placed with Amerigel ointment dressing. Keep the dressing and pad for 5 days and return in one week.

## 2017-10-26 ENCOUNTER — Encounter: Payer: Self-pay | Admitting: Podiatry

## 2017-10-26 ENCOUNTER — Ambulatory Visit: Payer: BC Managed Care – PPO | Admitting: Podiatry

## 2017-10-26 DIAGNOSIS — L97521 Non-pressure chronic ulcer of other part of left foot limited to breakdown of skin: Secondary | ICD-10-CM | POA: Diagnosis not present

## 2017-10-26 DIAGNOSIS — M216X2 Other acquired deformities of left foot: Secondary | ICD-10-CM | POA: Diagnosis not present

## 2017-10-26 DIAGNOSIS — E0842 Diabetes mellitus due to underlying condition with diabetic polyneuropathy: Secondary | ICD-10-CM | POA: Diagnosis not present

## 2017-10-26 NOTE — Patient Instructions (Signed)
Follow up on left foot ulcer. Lesion debrided and aperture pad placed with Amerigel ointment. Do dressing change with antibiotics and keep the pad. Return in 2 weeks.

## 2017-10-26 NOTE — Progress Notes (Signed)
Subjective: 57 y.o. year old male patient presents for follow up on left foot ulcer.  Objective: Dermatologic: Ulcerated callus under the 5th MPJ left, 1.3 x 0.5 cm without active drainage, covered with thick callus. Vascular: Pedal pulses are not palpable. 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 Neurologic: Diagnosed with Peripheral neuropathy.  Assessment: Ulcer 5th MPJ left foot, 1.3 x 0.5 cm limited to breakdown of skin. Cavovarus foot with abnormal lateral weight shifting.  Treatment: Ulcerated lesion debrided and padded. Keep the pad and change dressing daily. Patient is informed to use Ritch brace daily to off load the ulcer site. Return in 2 weeks.

## 2017-11-08 ENCOUNTER — Encounter: Payer: Self-pay | Admitting: Podiatry

## 2017-11-08 ENCOUNTER — Ambulatory Visit: Payer: BC Managed Care – PPO | Admitting: Podiatry

## 2017-11-08 DIAGNOSIS — L97521 Non-pressure chronic ulcer of other part of left foot limited to breakdown of skin: Secondary | ICD-10-CM | POA: Diagnosis not present

## 2017-11-08 DIAGNOSIS — E0842 Diabetes mellitus due to underlying condition with diabetic polyneuropathy: Secondary | ICD-10-CM

## 2017-11-08 DIAGNOSIS — M216X2 Other acquired deformities of left foot: Secondary | ICD-10-CM

## 2017-11-08 NOTE — Patient Instructions (Signed)
Seen for follow up on left foot ulcer. Still in need of complete off loading. Lesion debrided and padded. Keep the pad on for a week and change dressing. Return in 2 weeks.

## 2017-11-08 NOTE — Progress Notes (Signed)
Patient ID: Scott Flowers, male   DOB: 02-08-1961, 57 y.o.   MRN: [id number] Subjective: 57 y.o. year old male patient presents for 2 week follow up on left foot ulcer.  Objective: Dermatologic: Ulcerating callus with thick keratotic tissue build up. Center base of the ulcer is about 1x1 cm without active drainage. Thin shiny and flaky skin throughout the lower limb. Vascular: Pedal pulses are not palpable. 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 Neurologic: diagnosed with severe peripheral neuropathy.  Assessment: Ulcer under 5th MPJ left, 1x1cm limited to breakdown of skin without active drainage or associated edema or erythema. Cavovarus foot with lateral weight shifting on ulcer site left. Severe peripheral neuropathy.  Treatment: Lesion debrided. Aperture pad 1/4" Felt pad with Amerigel ointment dressing applied. Instructed to keep the pad on for a week. Then replace the pad after each bath. Return in 2 weeks.

## 2017-11-29 ENCOUNTER — Encounter: Payer: Self-pay | Admitting: Podiatry

## 2017-11-29 ENCOUNTER — Ambulatory Visit: Payer: BC Managed Care – PPO | Admitting: Podiatry

## 2017-11-29 DIAGNOSIS — E0842 Diabetes mellitus due to underlying condition with diabetic polyneuropathy: Secondary | ICD-10-CM

## 2017-11-29 DIAGNOSIS — M216X2 Other acquired deformities of left foot: Secondary | ICD-10-CM

## 2017-11-29 DIAGNOSIS — L97521 Non-pressure chronic ulcer of other part of left foot limited to breakdown of skin: Secondary | ICD-10-CM | POA: Diagnosis not present

## 2017-11-29 NOTE — Patient Instructions (Signed)
Follow up on left foot ulcer. Lesion debrided and padded. Continue with off loading, keep padding, and use Ritch brace. Return in 3 weeks.

## 2017-11-29 NOTE — Progress Notes (Signed)
Subjective: 57 y.o. year old male patient presents for 3 week follow up on left foot ulcer. Patient has not been on Ritch brace lately due to his wife's busy schedule.  Objective: Dermatologic:  Ulcerating callus with thick keratotic tissue build up. Base surface is about 2.5 cm in diameter. Thin shiny and flaky skin throughout the lower limb. Vascular: Pedal pulses are not palpable. 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 Neurologic: diagnosed with severe peripheral neuropathy.  Assessment: Ulcerating callus under 5th MPJ left, 2.5 cm keratotic base. No drainage or associated edema or erythema noted. Cavovarus foot with lateral weight shifting on ulcer site left. Severe peripheral neuropathy.  Treatment: Lesion debrided. Aperture pad 1/4" Felt pad with Amerigel ointment dressing applied. Instructed to keep the pad on for a week. Then replace the pad after each bath. Return in 3 weeks.

## 2017-12-27 ENCOUNTER — Encounter: Payer: Self-pay | Admitting: Podiatry

## 2017-12-27 ENCOUNTER — Ambulatory Visit: Payer: BC Managed Care – PPO | Admitting: Podiatry

## 2017-12-27 DIAGNOSIS — L97521 Non-pressure chronic ulcer of other part of left foot limited to breakdown of skin: Secondary | ICD-10-CM | POA: Diagnosis not present

## 2017-12-27 DIAGNOSIS — M216X2 Other acquired deformities of left foot: Secondary | ICD-10-CM

## 2017-12-27 DIAGNOSIS — E0842 Diabetes mellitus due to underlying condition with diabetic polyneuropathy: Secondary | ICD-10-CM

## 2017-12-27 NOTE — Patient Instructions (Signed)
Follow up on left foot ulcer. No active drainage noted. Lesion debrided and padded. All nails debrided. Return in one month.

## 2017-12-27 NOTE — Progress Notes (Signed)
Subjective: 57 y.o.year old malepatient presents for one month follow up on left foot ulcer.  Patient is not wearing Ritch brace.  Objective: Dermatologic: Ulcerating callus with thick keratotic tissue build up. Dry ulcer base plantar 5th MPJ area left foot. No drainage or associated edema or erythema noted. Thin shiny and flaky skin throughout the lower limb. Thick dystrophic nails x 10. Vascular:Pedal pulses are notpalpable. 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 Neurologic:diagnosed with severe peripheral neuropathy.  Assessment: Ulcerating callus under 5th MPJ left, dry callused keratotic base. Mycotic nails x 10. Cavovarus foot with lateral weight shifting on ulcer site left. Severe peripheral neuropathy.  Treatment: Lesion debrided. Aperture pad 1/4" Felt pad with Amerigel ointment dressing applied. Instructed to keep the pad on for a week. Then replace the pad after each bath. Return in 3 weeks.

## 2018-01-31 ENCOUNTER — Ambulatory Visit: Payer: BC Managed Care – PPO | Admitting: Podiatry

## 2022-02-14 ENCOUNTER — Other Ambulatory Visit (HOSPITAL_COMMUNITY): Payer: Self-pay

## 2022-02-15 ENCOUNTER — Other Ambulatory Visit (HOSPITAL_COMMUNITY): Payer: Self-pay

## 2022-02-15 MED ORDER — OZEMPIC (2 MG/DOSE) 8 MG/3ML ~~LOC~~ SOPN
2.0000 mg | PEN_INJECTOR | SUBCUTANEOUS | 3 refills | Status: AC
  Filled 2022-02-15: qty 3, 28d supply, fill #0

## 2022-02-15 MED ORDER — OZEMPIC (2 MG/DOSE) 8 MG/3ML ~~LOC~~ SOPN: 2.0000 mg | PEN_INJECTOR | SUBCUTANEOUS | 1 refills | Status: AC

## 2022-04-07 ENCOUNTER — Other Ambulatory Visit (HOSPITAL_COMMUNITY): Payer: Self-pay

## 2022-05-09 ENCOUNTER — Other Ambulatory Visit: Payer: Self-pay

## 2022-07-12 ENCOUNTER — Other Ambulatory Visit: Payer: Self-pay
